# Patient Record
Sex: Female | Born: 1950 | Race: White | Hispanic: No | Smoking: Former smoker
Health system: Southern US, Community
[De-identification: ages and names within clinical notes are randomized; demographics above are authoritative.]

## PROBLEM LIST (undated history)

## (undated) DIAGNOSIS — D649 Anemia, unspecified: Secondary | ICD-10-CM

## (undated) DIAGNOSIS — N189 Chronic kidney disease, unspecified: Secondary | ICD-10-CM

## (undated) DIAGNOSIS — E119 Type 2 diabetes mellitus without complications: Secondary | ICD-10-CM

## (undated) DIAGNOSIS — I10 Essential (primary) hypertension: Secondary | ICD-10-CM

## (undated) DIAGNOSIS — I509 Heart failure, unspecified: Secondary | ICD-10-CM

## (undated) DIAGNOSIS — I499 Cardiac arrhythmia, unspecified: Secondary | ICD-10-CM

---

## 2022-05-01 ENCOUNTER — Other Ambulatory Visit: Payer: Self-pay

## 2022-05-01 ENCOUNTER — Emergency Department (HOSPITAL_COMMUNITY): Payer: Medicare Other

## 2022-05-01 ENCOUNTER — Emergency Department (HOSPITAL_COMMUNITY)
Admission: EM | Admit: 2022-05-01 | Discharge: 2022-05-01 | Disposition: A | Discharge status: Home / Self Care | Payer: Medicare Other | Attending: Emergency Medicine | Admitting: Emergency Medicine

## 2022-05-01 ENCOUNTER — Encounter (HOSPITAL_COMMUNITY): Payer: Self-pay | Admitting: Emergency Medicine

## 2022-05-01 DIAGNOSIS — I120 Hypertensive chronic kidney disease with stage 5 chronic kidney disease or end stage renal disease: Secondary | ICD-10-CM | POA: Diagnosis not present

## 2022-05-01 DIAGNOSIS — Z992 Dependence on renal dialysis: Secondary | ICD-10-CM | POA: Diagnosis not present

## 2022-05-01 DIAGNOSIS — N186 End stage renal disease: Secondary | ICD-10-CM | POA: Insufficient documentation

## 2022-05-01 DIAGNOSIS — R0602 Shortness of breath: Secondary | ICD-10-CM | POA: Diagnosis present

## 2022-05-01 LAB — TROPONIN I (HIGH SENSITIVITY)
Troponin I (High Sensitivity): 14 ng/L (ref <18)
Troponin I (High Sensitivity): 13 ng/L (ref <18)

## 2022-05-01 LAB — COMPREHENSIVE METABOLIC PANEL
ALT: 23 U/L (ref 0–44)
AST: 29 U/L (ref 15–41)
Albumin: 2.7 g/dL — ABNORMAL LOW (ref 3.5–5.0)
Alkaline Phosphatase: 138 U/L — ABNORMAL HIGH (ref 38–126)
Anion gap: 10 (ref 5–15)
BUN: 22 mg/dL (ref 8–23)
CO2: 31 mmol/L (ref 22–32)
Calcium: 7.9 mg/dL — ABNORMAL LOW (ref 8.9–10.3)
Chloride: 100 mmol/L (ref 98–111)
Creatinine, Ser: 4.18 mg/dL — ABNORMAL HIGH (ref 0.44–1.00)
GFR, Estimated: 11 mL/min — ABNORMAL LOW (ref >60)
Glucose, Bld: 110 mg/dL — ABNORMAL HIGH (ref 70–99)
Potassium: 3.4 mmol/L — ABNORMAL LOW (ref 3.5–5.1)
Sodium: 141 mmol/L (ref 135–145)
Total Bilirubin: 1.3 mg/dL — ABNORMAL HIGH (ref 0.3–1.2)
Total Protein: 5.8 g/dL — ABNORMAL LOW (ref 6.5–8.1)

## 2022-05-01 LAB — CBC WITH DIFFERENTIAL/PLATELET
Abs Immature Granulocytes: 0.02 10*3/uL (ref 0.00–0.07)
Basophils Absolute: 0 10*3/uL (ref 0.0–0.1)
Basophils Relative: 1 %
Eosinophils Absolute: 0.4 10*3/uL (ref 0.0–0.5)
Eosinophils Relative: 11 %
HCT: 29.4 % — ABNORMAL LOW (ref 36.0–46.0)
Hemoglobin: 10.2 g/dL — ABNORMAL LOW (ref 12.0–15.0)
Immature Granulocytes: 1 %
Lymphocytes Relative: 19 %
Lymphs Abs: 0.7 10*3/uL (ref 0.7–4.0)
MCH: 36.3 pg — ABNORMAL HIGH (ref 26.0–34.0)
MCHC: 34.7 g/dL (ref 30.0–36.0)
MCV: 104.6 fL — ABNORMAL HIGH (ref 80.0–100.0)
Monocytes Absolute: 0.3 10*3/uL (ref 0.1–1.0)
Monocytes Relative: 9 %
Neutro Abs: 2.1 10*3/uL (ref 1.7–7.7)
Neutrophils Relative %: 59 %
Platelets: 39 10*3/uL — ABNORMAL LOW (ref 150–400)
RBC: 2.81 MIL/uL — ABNORMAL LOW (ref 3.87–5.11)
RDW: 13.7 % (ref 11.5–15.5)
WBC: 3.5 10*3/uL — ABNORMAL LOW (ref 4.0–10.5)
nRBC: 0 % (ref 0.0–0.2)

## 2022-05-01 LAB — MAGNESIUM: Magnesium: 2.1 mg/dL (ref 1.7–2.4)

## 2022-05-01 LAB — BRAIN NATRIURETIC PEPTIDE: B Natriuretic Peptide: 446.1 pg/mL — ABNORMAL HIGH (ref 0.0–100.0)

## 2022-05-01 NOTE — ED Provider Notes (Addendum)
?[place] ?Provider Note ? ? ?CSN: [id number] ?Arrival date & time: 05/01/22  1004 ? ?  ? ?History ? ?Chief Complaint  ?Patient presents with  ? Shortness of Breath  ? Hypotension  ? ? ?Dorothy Cooper is a 72 y.o. female. ? ?72 year old female with prior medical history as detailed below presents for evaluation.  Patient reportedly was at dialysis.  Patient's dialysis session was stopped after she complained of shortness of breath.  Patient reports improvement in her symptoms now.  Patient's blood pressure may have transiently dropped during dialysis as well. ? ?Patient is otherwise unable to provide significant details of the events leading to her visit today. ? ?She does not appear to have acute complaint. ? ?The history is provided by the patient and medical records.  ?Shortness of Breath ?Severity:  Mild ?Onset quality:  Unable to specify ?Timing:  Unable to specify ?Progression:  Unable to specify ? ?  ? ?Home Medications ?Prior to Admission medications   ?Not on File  ?   ? ?Allergies    ?Patient has no allergy information on record.   ? ?Review of Systems   ?Review of Systems  ?Respiratory:  Positive for shortness of breath.   ? ?Physical Exam ?Updated Vital Signs ?BP (!) 141/89   Pulse 80   Temp 98.7 ?F (37.1 ?C) (Oral)   Resp 17   LMP  (LMP Unknown)   SpO2 96%  ?Physical Exam ?Vitals and nursing note reviewed.  ?Constitutional:   ?   General: She is not in acute distress. ?   Appearance: Normal appearance. She is well-developed.  ?HENT:  ?   Head: Normocephalic and atraumatic.  ?Eyes:  ?   Conjunctiva/sclera: Conjunctivae normal.  ?   Pupils: Pupils are equal, round, and reactive to light.  ?Cardiovascular:  ?   Rate and Rhythm: Normal rate and regular rhythm.  ?   Heart sounds: Normal heart sounds.  ?Pulmonary:  ?   Effort: Pulmonary effort is normal. No respiratory distress.  ?   Breath sounds: Normal breath sounds.  ?Chest:  ?   Comments: Dialysis access catheter  in the right anterior chest wall.  No surrounding erythema noted. ?Abdominal:  ?   General: There is no distension.  ?   Palpations: Abdomen is soft.  ?   Tenderness: There is no abdominal tenderness.  ?Musculoskeletal:     ?   General: No deformity. Normal range of motion.  ?   Cervical back: Normal range of motion and neck supple.  ?   Right lower leg: Edema present.  ?   Left lower leg: Edema present.  ?   Comments: AV fistula in left upper extremity with palpable thrill and no surrounding erythema or signs of cellulitis.  ?Skin: ?   General: Skin is warm and dry.  ?Neurological:  ?   General: No focal deficit present.  ?   Mental Status: She is alert and oriented to person, place, and time.  ? ? ?ED Results / Procedures / Treatments   ?Labs ?(all labs ordered are listed, but only abnormal results are displayed) ?Labs Reviewed  ?COMPREHENSIVE METABOLIC PANEL - Abnormal; Notable for the following components:  ?    Result Value  ? Potassium 3.4 (*)   ? Glucose, Bld 110 (*)   ? Creatinine, Ser 4.18 (*)   ? Calcium 7.9 (*)   ? Total Protein 5.8 (*)   ? Albumin 2.7 (*)   ? Alkaline Phosphatase  138 (*)   ? Total Bilirubin 1.3 (*)   ? GFR, Estimated 11 (*)   ? All other components within normal limits  ?CBC WITH DIFFERENTIAL/PLATELET - Abnormal; Notable for the following components:  ? WBC 3.5 (*)   ? RBC 2.81 (*)   ? Hemoglobin 10.2 (*)   ? HCT 29.4 (*)   ? MCV 104.6 (*)   ? MCH 36.3 (*)   ? Platelets 39 (*)   ? All other components within normal limits  ?BRAIN NATRIURETIC PEPTIDE - Abnormal; Notable for the following components:  ? B Natriuretic Peptide 446.1 (*)   ? All other components within normal limits  ?MAGNESIUM  ?CBG MONITORING, ED  ?TROPONIN I (HIGH SENSITIVITY)  ?TROPONIN I (HIGH SENSITIVITY)  ? ? ?EKG ?EKG Interpretation ? ?Date/Time:  Tuesday May 01 2022 10:01:21 EDT ?Ventricular Rate:  80 ?PR Interval:  150 ?QRS Duration: 72 ?QT Interval:  420 ?QTC Calculation: 484 ?R Axis:   36 ?Text  Interpretation: Normal sinus rhythm Cannot rule out Anterior infarct , age undetermined Abnormal ECG No previous ECGs available Confirmed by Dene Gentry [phone]) on 05/01/2022 1:02:01 PM ? ?Radiology ?DG Chest 2 View ? ?Result Date: 05/01/2022 ?CLINICAL DATA:  Shortness of breath. EXAM: CHEST - 2 VIEW COMPARISON:  None Available. FINDINGS: Mild cardiomegaly is noted. Mild bibasilar subsegmental atelectasis or possibly edema is noted with small bilateral pleural effusions. Bony thorax is unremarkable. IMPRESSION: Mild bibasilar subsegmental atelectasis or possibly edema is noted with small bilateral pleural effusions. Electronically Signed   By: Marijo Conception M.D.   On: 05/01/2022 10:55   ? ?Procedures ?Procedures  ? ? ?Medications Ordered in ED ?Medications - No data to display ? ?ED Course/ Medical Decision Making/ A&P ?  ?                        ?Medical Decision Making ? ? ?Medical Screen Complete ? ?This patient presented to the ED with complaint of SOB/ESRD. ? ?This complaint involves an extensive number of treatment options. The initial differential diagnosis includes, but is not limited to, fluid overload, pneumonia, metabolic abnormality, etc. ? ?This presentation is: Acute, Chronic, Self-Limited, Previously Undiagnosed, Uncertain Prognosis, Complicated, Systemic Symptoms, and Threat to Life/Bodily Function ? ?Patient with apparent reported transient drop in blood pressure during dialysis session today. ? ?Patient without current acute complaint. ? ?Patient's hemodynamics here in the ED are reassuringly normal. ? ?Screening labs obtained without significant abnormality.  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  Patient is currently on a Tuesday, Thursday, Saturday dialysis schedule. ? ?Patient without evidence of significant acute pathology on work-up here in the ED. ? ?She is appropriate for discharge. ? ?However, patient's demographics are not updated.  It is unclear which facility she resides  at.  Contact information for next of kin is also not up-to-date. ? ?1630   Additional history obtained from patient's granddaughter.  Patient now at a new nursing facility and new dialysis center.  Patient apparently did not receive midodrine as she has previously prior to initiating dialysis.  This was the reason for her drop in blood pressure.  Patient does not meet criteria for inpatient dialysis today.  Her potassium is normal.  She is without evidence of significant fluid overload or hypoxemia.  Patient is appropriate for discharge and should be able to get her next dialysis session on Thursday.  Patient's granddaughter has already spoken to her new facility and advised them  that the midodrine should be given to the patient prior to transport to dialysis. ? ?Co morbidities that complicated the patient's evaluation ? ?ESRD on HD ? ? ?Additional history obtained: ? ?External records from outside sources obtained and reviewed including prior ED visits and prior Inpatient records.  ? ? ?Lab Tests: ? ?I ordered and personally interpreted labs.  The pertinent results include: CBC, CMP, magnesium, troponin, BNP ? ? ?Imaging Studies ordered: ? ?I ordered imaging studies including chest x-ray ?I independently visualized and interpreted obtained imaging which showed NAD ?I agree with the radiologist interpretation. ? ? ?Cardiac Monitoring: ? ?The patient was maintained on a cardiac monitor.  I personally viewed and interpreted the cardiac monitor which showed an underlying rhythm of: NSR ? ?Problem List / ED Course: ? ?ESRD ? ? ?Reevaluation: ? ?After the interventions noted above, I reevaluated the patient and found that they have: stayed the same ? ? ?Disposition: ? ?After consideration of the diagnostic results and the patients response to treatment, I feel that the patent would benefit from close outpatient follow-up.  ? ? ? ? ? ? ? ? ?Final Clinical Impression(s) / ED Diagnoses ?Final diagnoses:  ?ESRD (end stage  renal disease) ([place])  ? ? ?Rx / DC Orders ?ED Discharge Orders   ? ? None  ? ?  ? ? ?  ?Valarie Merino, MD ?05/01/22 1417 ? ?  ?Valarie Merino, MD ?05/02/22 [phone] ? ?

## 2022-05-01 NOTE — ED Notes (Signed)
Patient currently residing at [facility].  ?

## 2022-05-01 NOTE — ED Provider Notes (Incomplete Revision)
?[place] ?Provider Note ? ? ?CSN: [id number] ?Arrival date & time: 05/01/22  1004 ? ?  ? ?History ? ?Chief Complaint  ?Patient presents with  ?? Shortness of Breath  ?? Hypotension  ? ? ?Dorothy Cooper is a 72 y.o. female. ? ?72 year old female with prior medical history as detailed below presents for evaluation.  Patient reportedly was at dialysis.  Patient's dialysis session was stopped after she complained of shortness of breath.  Patient reports improvement in her symptoms now.  Patient's blood pressure may have transiently dropped during dialysis as well. ? ?Patient is otherwise unable to provide significant details of the events leading to her visit today. ? ?She does not appear to have acute complaint. ? ?The history is provided by the patient and medical records.  ?Shortness of Breath ?Severity:  Mild ?Onset quality:  Unable to specify ?Timing:  Unable to specify ?Progression:  Unable to specify ? ?  ? ?Home Medications ?Prior to Admission medications   ?Not on File  ?   ? ?Allergies    ?Patient has no allergy information on record.   ? ?Review of Systems   ?Review of Systems  ?Respiratory:  Positive for shortness of breath.   ? ?Physical Exam ?Updated Vital Signs ?BP (!) 141/89   Pulse 80   Temp 98.7 ?F (37.1 ?C) (Oral)   Resp 17   LMP  (LMP Unknown)   SpO2 96%  ?Physical Exam ?Vitals and nursing note reviewed.  ?Constitutional:   ?   General: She is not in acute distress. ?   Appearance: Normal appearance. She is well-developed.  ?HENT:  ?   Head: Normocephalic and atraumatic.  ?Eyes:  ?   Conjunctiva/sclera: Conjunctivae normal.  ?   Pupils: Pupils are equal, round, and reactive to light.  ?Cardiovascular:  ?   Rate and Rhythm: Normal rate and regular rhythm.  ?   Heart sounds: Normal heart sounds.  ?Pulmonary:  ?   Effort: Pulmonary effort is normal. No respiratory distress.  ?   Breath sounds: Normal breath sounds.  ?Chest:  ?   Comments: Dialysis access  catheter in the right anterior chest wall.  No surrounding erythema noted. ?Abdominal:  ?   General: There is no distension.  ?   Palpations: Abdomen is soft.  ?   Tenderness: There is no abdominal tenderness.  ?Musculoskeletal:     ?   General: No deformity. Normal range of motion.  ?   Cervical back: Normal range of motion and neck supple.  ?   Right lower leg: Edema present.  ?   Left lower leg: Edema present.  ?   Comments: AV fistula in left upper extremity with palpable thrill and no surrounding erythema or signs of cellulitis.  ?Skin: ?   General: Skin is warm and dry.  ?Neurological:  ?   General: No focal deficit present.  ?   Mental Status: She is alert and oriented to person, place, and time.  ? ? ?ED Results / Procedures / Treatments   ?Labs ?(all labs ordered are listed, but only abnormal results are displayed) ?Labs Reviewed  ?COMPREHENSIVE METABOLIC PANEL - Abnormal; Notable for the following components:  ?    Result Value  ? Potassium 3.4 (*)   ? Glucose, Bld 110 (*)   ? Creatinine, Ser 4.18 (*)   ? Calcium 7.9 (*)   ? Total Protein 5.8 (*)   ? Albumin 2.7 (*)   ? Alkaline Phosphatase  138 (*)   ? Total Bilirubin 1.3 (*)   ? GFR, Estimated 11 (*)   ? All other components within normal limits  ?CBC WITH DIFFERENTIAL/PLATELET - Abnormal; Notable for the following components:  ? WBC 3.5 (*)   ? RBC 2.81 (*)   ? Hemoglobin 10.2 (*)   ? HCT 29.4 (*)   ? MCV 104.6 (*)   ? MCH 36.3 (*)   ? Platelets 39 (*)   ? All other components within normal limits  ?BRAIN NATRIURETIC PEPTIDE - Abnormal; Notable for the following components:  ? B Natriuretic Peptide 446.1 (*)   ? All other components within normal limits  ?MAGNESIUM  ?CBG MONITORING, ED  ?TROPONIN I (HIGH SENSITIVITY)  ?TROPONIN I (HIGH SENSITIVITY)  ? ? ?EKG ?EKG Interpretation ? ?Date/Time:  Tuesday May 01 2022 10:01:21 EDT ?Ventricular Rate:  80 ?PR Interval:  150 ?QRS Duration: 72 ?QT Interval:  420 ?QTC Calculation: 484 ?R Axis:   36 ?Text  Interpretation: Normal sinus rhythm Cannot rule out Anterior infarct , age undetermined Abnormal ECG No previous ECGs available Confirmed by Dene Gentry [phone]) on 05/01/2022 1:02:01 PM ? ?Radiology ?DG Chest 2 View ? ?Result Date: 05/01/2022 ?CLINICAL DATA:  Shortness of breath. EXAM: CHEST - 2 VIEW COMPARISON:  None Available. FINDINGS: Mild cardiomegaly is noted. Mild bibasilar subsegmental atelectasis or possibly edema is noted with small bilateral pleural effusions. Bony thorax is unremarkable. IMPRESSION: Mild bibasilar subsegmental atelectasis or possibly edema is noted with small bilateral pleural effusions. Electronically Signed   By: Marijo Conception M.D.   On: 05/01/2022 10:55   ? ?Procedures ?Procedures  ? ? ?Medications Ordered in ED ?Medications - No data to display ? ?ED Course/ Medical Decision Making/ A&P ?  ?                        ?Medical Decision Making ? ? ?Medical Screen Complete ? ?This patient presented to the ED with complaint of SOB/ESRD. ? ?This complaint involves an extensive number of treatment options. The initial differential diagnosis includes, but is not limited to, fluid overload, pneumonia, metabolic abnormality, etc. ? ?This presentation is: Acute, Chronic, Self-Limited, Previously Undiagnosed, Uncertain Prognosis, Complicated, Systemic Symptoms, and Threat to Life/Bodily Function ? ?Patient with apparent reported transient drop in blood pressure during dialysis session today. ? ?Patient without current acute complaint. ? ?Patient's hemodynamics here in the ED are reassuringly normal. ? ?Screening labs obtained without significant abnormality.  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  Patient is currently on a Tuesday, Thursday, Saturday dialysis schedule. ? ?Patient without evidence of significant acute pathology on work-up here in the ED. ? ?She is appropriate for discharge. ? ?However, patient's demographics are not updated.  It is unclear which facility she resides  at.  Contact information for next of kin is also not up-to-date. ? ?1630   Additional history obtained from patient's granddaughter.  Patient with new facility and new dialysis chair.  Patient apparently did not receive midodrine as she has previously prior to initiating dialysis.  This was the reason for her drop in blood pressure.  Patient does not meet criteria for dialysis today.  Her potassium is normal.  She is without evidence of significant fluid overload or hypoxemia.  Patient is appropriate for discharge and should be able to get her next dialysis session on Thursday.  Patient's granddaughter is already spoken to her new facility and advised them that the midodrine should  be given to the patient prior to transport to dialysis. ? ?Co morbidities that complicated the patient's evaluation ? ?ESRD on HD ? ? ?Additional history obtained: ? ?External records from outside sources obtained and reviewed including prior ED visits and prior Inpatient records.  ? ? ?Lab Tests: ? ?I ordered and personally interpreted labs.  The pertinent results include: CBC, CMP, magnesium, troponin, BNP ? ? ?Imaging Studies ordered: ? ?I ordered imaging studies including chest x-ray ?I independently visualized and interpreted obtained imaging which showed NAD ?I agree with the radiologist interpretation. ? ? ?Cardiac Monitoring: ? ?The patient was maintained on a cardiac monitor.  I personally viewed and interpreted the cardiac monitor which showed an underlying rhythm of: NSR ? ?Problem List / ED Course: ? ?ESRD ? ? ?Reevaluation: ? ?After the interventions noted above, I reevaluated the patient and found that they have: stayed the same ? ? ?Disposition: ? ?After consideration of the diagnostic results and the patients response to treatment, I feel that the patent would benefit from close outpatient follow-up.  ? ? ? ? ? ? ? ? ?Final Clinical Impression(s) / ED Diagnoses ?Final diagnoses:  ?ESRD (end stage renal disease) ([place])   ? ? ?Rx / DC Orders ?ED Discharge Orders   ? ? None  ? ?  ? ? ?  ?Valarie Merino, MD ?05/01/22 1417 ? ?

## 2022-05-01 NOTE — ED Provider Triage Note (Signed)
Emergency Medicine Provider Triage Evaluation Note ? ?Dorothy Cooper , a 72 y.o. female  was evaluated in triage.  Patient came from dialysis.  She reports that she all of a sudden got short of breath so they stopped and sent her to the emergency department.  Says that she is not oxygen only during dialysis but does not wear any at home.  Says that she did not complete dialysis. ? ?Also reports a history of heart failure and diabetes ?Review of Systems  ?Positive: Dizziness and weakness and some chest discomfort, increased from usual ?Negative: Syncope ? ?Physical Exam  ?BP (!) 124/53 (BP Location: Right Arm)   Pulse 80   Temp 98.7 ?F (37.1 ?C) (Oral)   Resp 16   SpO2 97%  ?Gen:   Awake, no distress   ?Resp:  Normal effort  ?MSK:   Moves extremities without difficulty  ?Other:  Regular rate and rhythm, decreased low lung field.  Normal work of breathing ? ?Medical Decision Making  ?Medically screening exam initiated at 10:22 AM.  Appropriate orders placed.  Dorothy Cooper was informed that the remainder of the evaluation will be completed by another provider, this initial triage assessment does not replace that evaluation, and the importance of remaining in the ED until their evaluation is complete. ? ? ?  ?Rhae Hammock, PA-C ?05/01/22 1028 ? ?

## 2022-05-01 NOTE — ED Notes (Signed)
Pt sent home with [facility] in transport [place].  Christina-NT and I got her dressed and into wheelchair and the transport driver roller her out in her wheelchair.  Paperwork, clothes, hoya pad and paperwork all went with pt.  ?

## 2022-05-01 NOTE — ED Triage Notes (Signed)
EMS stated, from dialysis her BP dropped , stopped dialysis and BP came back up and she had some SOB ?

## 2022-05-01 NOTE — Discharge Instructions (Addendum)
Return for any problem.  ?

## 2022-08-19 ENCOUNTER — Emergency Department (HOSPITAL_COMMUNITY): Payer: Medicare Other

## 2022-08-19 ENCOUNTER — Inpatient Hospital Stay (HOSPITAL_COMMUNITY)
Admission: EM | Admit: 2022-08-19 | Discharge: 2022-08-25 | DRG: 689 | Disposition: A | Discharge status: Hospice | Payer: Medicare Other | Attending: Internal Medicine | Admitting: Internal Medicine

## 2022-08-19 ENCOUNTER — Encounter (HOSPITAL_COMMUNITY): Payer: Self-pay | Admitting: Internal Medicine

## 2022-08-19 ENCOUNTER — Other Ambulatory Visit: Payer: Self-pay

## 2022-08-19 DIAGNOSIS — M898X9 Other specified disorders of bone, unspecified site: Secondary | ICD-10-CM | POA: Diagnosis present

## 2022-08-19 DIAGNOSIS — D631 Anemia in chronic kidney disease: Secondary | ICD-10-CM | POA: Diagnosis present

## 2022-08-19 DIAGNOSIS — Z1612 Extended spectrum beta lactamase (ESBL) resistance: Secondary | ICD-10-CM | POA: Diagnosis present

## 2022-08-19 DIAGNOSIS — N390 Urinary tract infection, site not specified: Principal | ICD-10-CM | POA: Diagnosis present

## 2022-08-19 DIAGNOSIS — K7682 Hepatic encephalopathy: Secondary | ICD-10-CM | POA: Diagnosis present

## 2022-08-19 DIAGNOSIS — R4781 Slurred speech: Secondary | ICD-10-CM | POA: Diagnosis present

## 2022-08-19 DIAGNOSIS — I132 Hypertensive heart and chronic kidney disease with heart failure and with stage 5 chronic kidney disease, or end stage renal disease: Secondary | ICD-10-CM | POA: Diagnosis present

## 2022-08-19 DIAGNOSIS — N3000 Acute cystitis without hematuria: Secondary | ICD-10-CM

## 2022-08-19 DIAGNOSIS — Z886 Allergy status to analgesic agent status: Secondary | ICD-10-CM

## 2022-08-19 DIAGNOSIS — R066 Hiccough: Secondary | ICD-10-CM | POA: Diagnosis present

## 2022-08-19 DIAGNOSIS — I5032 Chronic diastolic (congestive) heart failure: Secondary | ICD-10-CM | POA: Diagnosis present

## 2022-08-19 DIAGNOSIS — Z992 Dependence on renal dialysis: Secondary | ICD-10-CM

## 2022-08-19 DIAGNOSIS — L8990 Pressure ulcer of unspecified site, unspecified stage: Secondary | ICD-10-CM | POA: Insufficient documentation

## 2022-08-19 DIAGNOSIS — Z993 Dependence on wheelchair: Secondary | ICD-10-CM

## 2022-08-19 DIAGNOSIS — G43909 Migraine, unspecified, not intractable, without status migrainosus: Secondary | ICD-10-CM | POA: Diagnosis present

## 2022-08-19 DIAGNOSIS — M4854XA Collapsed vertebra, not elsewhere classified, thoracic region, initial encounter for fracture: Secondary | ICD-10-CM | POA: Diagnosis present

## 2022-08-19 DIAGNOSIS — E860 Dehydration: Secondary | ICD-10-CM | POA: Diagnosis present

## 2022-08-19 DIAGNOSIS — D696 Thrombocytopenia, unspecified: Secondary | ICD-10-CM | POA: Diagnosis present

## 2022-08-19 DIAGNOSIS — N186 End stage renal disease: Secondary | ICD-10-CM | POA: Diagnosis present

## 2022-08-19 DIAGNOSIS — R4182 Altered mental status, unspecified: Secondary | ICD-10-CM | POA: Diagnosis not present

## 2022-08-19 DIAGNOSIS — D539 Nutritional anemia, unspecified: Secondary | ICD-10-CM | POA: Diagnosis present

## 2022-08-19 DIAGNOSIS — E11649 Type 2 diabetes mellitus with hypoglycemia without coma: Secondary | ICD-10-CM | POA: Diagnosis not present

## 2022-08-19 DIAGNOSIS — I9589 Other hypotension: Secondary | ICD-10-CM

## 2022-08-19 DIAGNOSIS — L89321 Pressure ulcer of left buttock, stage 1: Secondary | ICD-10-CM | POA: Diagnosis present

## 2022-08-19 DIAGNOSIS — K7460 Unspecified cirrhosis of liver: Secondary | ICD-10-CM | POA: Diagnosis present

## 2022-08-19 DIAGNOSIS — J449 Chronic obstructive pulmonary disease, unspecified: Secondary | ICD-10-CM | POA: Diagnosis present

## 2022-08-19 DIAGNOSIS — L89622 Pressure ulcer of left heel, stage 2: Secondary | ICD-10-CM | POA: Diagnosis present

## 2022-08-19 DIAGNOSIS — G9341 Metabolic encephalopathy: Secondary | ICD-10-CM | POA: Diagnosis present

## 2022-08-19 DIAGNOSIS — Z880 Allergy status to penicillin: Secondary | ICD-10-CM

## 2022-08-19 DIAGNOSIS — R791 Abnormal coagulation profile: Secondary | ICD-10-CM | POA: Diagnosis present

## 2022-08-19 DIAGNOSIS — H409 Unspecified glaucoma: Secondary | ICD-10-CM | POA: Diagnosis present

## 2022-08-19 DIAGNOSIS — S32000A Wedge compression fracture of unspecified lumbar vertebra, initial encounter for closed fracture: Secondary | ICD-10-CM | POA: Diagnosis present

## 2022-08-19 DIAGNOSIS — E1122 Type 2 diabetes mellitus with diabetic chronic kidney disease: Secondary | ICD-10-CM | POA: Diagnosis present

## 2022-08-19 DIAGNOSIS — E861 Hypovolemia: Secondary | ICD-10-CM

## 2022-08-19 DIAGNOSIS — L89892 Pressure ulcer of other site, stage 2: Secondary | ICD-10-CM | POA: Diagnosis present

## 2022-08-19 DIAGNOSIS — R161 Splenomegaly, not elsewhere classified: Secondary | ICD-10-CM | POA: Diagnosis present

## 2022-08-19 DIAGNOSIS — K7210 Chronic hepatic failure without coma: Secondary | ICD-10-CM | POA: Diagnosis present

## 2022-08-19 DIAGNOSIS — G894 Chronic pain syndrome: Secondary | ICD-10-CM | POA: Diagnosis present

## 2022-08-19 DIAGNOSIS — M4856XA Collapsed vertebra, not elsewhere classified, lumbar region, initial encounter for fracture: Secondary | ICD-10-CM | POA: Diagnosis present

## 2022-08-19 DIAGNOSIS — D61818 Other pancytopenia: Secondary | ICD-10-CM | POA: Diagnosis present

## 2022-08-19 DIAGNOSIS — Z515 Encounter for palliative care: Secondary | ICD-10-CM

## 2022-08-19 DIAGNOSIS — Z79899 Other long term (current) drug therapy: Secondary | ICD-10-CM

## 2022-08-19 DIAGNOSIS — D472 Monoclonal gammopathy: Secondary | ICD-10-CM | POA: Diagnosis present

## 2022-08-19 DIAGNOSIS — Z87891 Personal history of nicotine dependence: Secondary | ICD-10-CM

## 2022-08-19 DIAGNOSIS — Z8249 Family history of ischemic heart disease and other diseases of the circulatory system: Secondary | ICD-10-CM

## 2022-08-19 DIAGNOSIS — I959 Hypotension, unspecified: Secondary | ICD-10-CM | POA: Diagnosis present

## 2022-08-19 DIAGNOSIS — Z66 Do not resuscitate: Secondary | ICD-10-CM | POA: Diagnosis present

## 2022-08-19 DIAGNOSIS — E785 Hyperlipidemia, unspecified: Secondary | ICD-10-CM | POA: Diagnosis present

## 2022-08-19 DIAGNOSIS — Z6823 Body mass index (BMI) 23.0-23.9, adult: Secondary | ICD-10-CM

## 2022-08-19 DIAGNOSIS — Z888 Allergy status to other drugs, medicaments and biological substances status: Secondary | ICD-10-CM

## 2022-08-19 DIAGNOSIS — I5042 Chronic combined systolic (congestive) and diastolic (congestive) heart failure: Secondary | ICD-10-CM | POA: Diagnosis present

## 2022-08-19 DIAGNOSIS — R64 Cachexia: Secondary | ICD-10-CM | POA: Diagnosis present

## 2022-08-19 DIAGNOSIS — Z638 Other specified problems related to primary support group: Secondary | ICD-10-CM

## 2022-08-19 DIAGNOSIS — I480 Paroxysmal atrial fibrillation: Secondary | ICD-10-CM | POA: Diagnosis present

## 2022-08-19 DIAGNOSIS — N939 Abnormal uterine and vaginal bleeding, unspecified: Secondary | ICD-10-CM | POA: Diagnosis present

## 2022-08-19 DIAGNOSIS — G928 Other toxic encephalopathy: Secondary | ICD-10-CM | POA: Diagnosis present

## 2022-08-19 DIAGNOSIS — D684 Acquired coagulation factor deficiency: Secondary | ICD-10-CM | POA: Diagnosis present

## 2022-08-19 DIAGNOSIS — Z833 Family history of diabetes mellitus: Secondary | ICD-10-CM

## 2022-08-19 DIAGNOSIS — E119 Type 2 diabetes mellitus without complications: Secondary | ICD-10-CM | POA: Diagnosis present

## 2022-08-19 DIAGNOSIS — Z7901 Long term (current) use of anticoagulants: Secondary | ICD-10-CM

## 2022-08-19 HISTORY — DX: Type 2 diabetes mellitus without complications: E11.9

## 2022-08-19 HISTORY — DX: Essential (primary) hypertension: I10

## 2022-08-19 HISTORY — DX: Heart failure, unspecified: I50.9

## 2022-08-19 HISTORY — DX: Chronic kidney disease, unspecified: N18.9

## 2022-08-19 HISTORY — DX: Cardiac arrhythmia, unspecified: I49.9

## 2022-08-19 HISTORY — DX: Anemia, unspecified: D64.9

## 2022-08-19 LAB — CBC WITH DIFFERENTIAL/PLATELET
Abs Immature Granulocytes: 0.03 10*3/uL (ref 0.00–0.07)
Basophils Absolute: 0 10*3/uL (ref 0.0–0.1)
Basophils Relative: 0 %
Eosinophils Absolute: 0 10*3/uL (ref 0.0–0.5)
Eosinophils Relative: 0 %
HCT: 27.4 % — ABNORMAL LOW (ref 36.0–46.0)
Hemoglobin: 9.1 g/dL — ABNORMAL LOW (ref 12.0–15.0)
Immature Granulocytes: 1 %
Lymphocytes Relative: 6 %
Lymphs Abs: 0.3 10*3/uL — ABNORMAL LOW (ref 0.7–4.0)
MCH: 35.4 pg — ABNORMAL HIGH (ref 26.0–34.0)
MCHC: 33.2 g/dL (ref 30.0–36.0)
MCV: 106.6 fL — ABNORMAL HIGH (ref 80.0–100.0)
Monocytes Absolute: 0.3 10*3/uL (ref 0.1–1.0)
Monocytes Relative: 6 %
Neutro Abs: 4.1 10*3/uL (ref 1.7–7.7)
Neutrophils Relative %: 87 %
Platelets: 26 10*3/uL — CL (ref 150–400)
RBC: 2.57 MIL/uL — ABNORMAL LOW (ref 3.87–5.11)
RDW: 17 % — ABNORMAL HIGH (ref 11.5–15.5)
WBC: 4.7 10*3/uL (ref 4.0–10.5)
nRBC: 0 % (ref 0.0–0.2)

## 2022-08-19 LAB — BLOOD GAS, VENOUS
Acid-Base Excess: 9.4 mmol/L — ABNORMAL HIGH (ref 0.0–2.0)
Bicarbonate: 35.3 mmol/L — ABNORMAL HIGH (ref 20.0–28.0)
Drawn by: 164
O2 Saturation: 78.4 %
Patient temperature: 37
pCO2, Ven: 52 mmHg (ref 44–60)
pH, Ven: 7.44 — ABNORMAL HIGH (ref 7.25–7.43)
pO2, Ven: 47 mmHg — ABNORMAL HIGH (ref 32–45)

## 2022-08-19 LAB — COMPREHENSIVE METABOLIC PANEL
ALT: 33 U/L (ref 0–44)
AST: 60 U/L — ABNORMAL HIGH (ref 15–41)
Albumin: 2.1 g/dL — ABNORMAL LOW (ref 3.5–5.0)
Alkaline Phosphatase: 75 U/L (ref 38–126)
Anion gap: 12 (ref 5–15)
BUN: 52 mg/dL — ABNORMAL HIGH (ref 8–23)
CO2: 28 mmol/L (ref 22–32)
Calcium: 10.1 mg/dL (ref 8.9–10.3)
Chloride: 92 mmol/L — ABNORMAL LOW (ref 98–111)
Creatinine, Ser: 5.35 mg/dL — ABNORMAL HIGH (ref 0.44–1.00)
GFR, Estimated: 8 mL/min — ABNORMAL LOW (ref >60)
Glucose, Bld: 179 mg/dL — ABNORMAL HIGH (ref 70–99)
Potassium: 4.1 mmol/L (ref 3.5–5.1)
Sodium: 132 mmol/L — ABNORMAL LOW (ref 135–145)
Total Bilirubin: 2.2 mg/dL — ABNORMAL HIGH (ref 0.3–1.2)
Total Protein: 5.4 g/dL — ABNORMAL LOW (ref 6.5–8.1)

## 2022-08-19 LAB — ABO/RH: ABO/RH(D): O POS

## 2022-08-19 LAB — DIC (DISSEMINATED INTRAVASCULAR COAGULATION)PANEL
D-Dimer, Quant: 3.19 ug/mL-FEU — ABNORMAL HIGH (ref 0.00–0.50)
Fibrinogen: 338 mg/dL (ref 210–475)
INR: 7.4 (ref 0.8–1.2)
Platelets: 21 10*3/uL — CL (ref 150–400)
Prothrombin Time: 62.8 seconds — ABNORMAL HIGH (ref 11.4–15.2)
Smear Review: NONE SEEN
aPTT: 62 seconds — ABNORMAL HIGH (ref 24–36)

## 2022-08-19 LAB — RETICULOCYTES
Immature Retic Fract: 30.1 % — ABNORMAL HIGH (ref 2.3–15.9)
RBC.: 2.84 MIL/uL — ABNORMAL LOW (ref 3.87–5.11)
Retic Count, Absolute: 160.7 10*3/uL (ref 19.0–186.0)
Retic Ct Pct: 5.7 % — ABNORMAL HIGH (ref 0.4–3.1)

## 2022-08-19 LAB — TYPE AND SCREEN
ABO/RH(D): O POS
Antibody Screen: NEGATIVE

## 2022-08-19 LAB — URINALYSIS, ROUTINE W REFLEX MICROSCOPIC
Glucose, UA: NEGATIVE mg/dL
Ketones, ur: 15 mg/dL — AB
Nitrite: POSITIVE — AB
Protein, ur: 100 mg/dL — AB
Specific Gravity, Urine: 1.02 (ref 1.005–1.030)
pH: 6.5 (ref 5.0–8.0)

## 2022-08-19 LAB — IRON AND TIBC
Iron: 23 ug/dL — ABNORMAL LOW (ref 28–170)
Saturation Ratios: UNDETERMINED % (ref 10.4–31.8)
TIBC: UNDETERMINED ug/dL (ref 250–450)
UIBC: UNDETERMINED ug/dL

## 2022-08-19 LAB — AMMONIA: Ammonia: 27 umol/L (ref 9–35)

## 2022-08-19 LAB — URINALYSIS, MICROSCOPIC (REFLEX)
Bacteria, UA: NONE SEEN
Squamous Epithelial / HPF: >50 (ref 0–5)
WBC, UA: >50 WBC/hpf (ref 0–5)

## 2022-08-19 LAB — LACTIC ACID, PLASMA
Lactic Acid, Venous: 2.1 mmol/L (ref 0.5–1.9)
Lactic Acid, Venous: 2.6 mmol/L (ref 0.5–1.9)

## 2022-08-19 LAB — LACTATE DEHYDROGENASE: LDH: 181 U/L (ref 98–192)

## 2022-08-19 LAB — PHOSPHORUS: Phosphorus: 4 mg/dL (ref 2.5–4.6)

## 2022-08-19 LAB — CK: Total CK: 48 U/L (ref 38–234)

## 2022-08-19 LAB — CBG MONITORING, ED: Glucose-Capillary: 161 mg/dL — ABNORMAL HIGH (ref 70–99)

## 2022-08-19 LAB — FERRITIN: Ferritin: 815 ng/mL — ABNORMAL HIGH (ref 11–307)

## 2022-08-19 LAB — MAGNESIUM: Magnesium: 2.9 mg/dL — ABNORMAL HIGH (ref 1.7–2.4)

## 2022-08-19 MED ORDER — LACTATED RINGERS IV BOLUS
1000.0000 mL | Freq: Once | INTRAVENOUS | Status: AC
  Administered 2022-08-19: 1000 mL via INTRAVENOUS

## 2022-08-19 MED ORDER — MIDODRINE HCL 5 MG PO TABS: 10.0000 mg | ORAL_TABLET | Freq: Three times a day (TID) | ORAL | Status: DC

## 2022-08-19 MED ORDER — SODIUM CHLORIDE 0.9 % IV SOLN
1.0000 g | INTRAVENOUS | Status: DC
  Administered 2022-08-20 – 2022-08-22 (×2): 1 g via INTRAVENOUS
  Filled 2022-08-19 (×2): qty 10

## 2022-08-19 MED ORDER — ACETAMINOPHEN 325 MG PO TABS: 650.0000 mg | ORAL_TABLET | Freq: Four times a day (QID) | ORAL | Status: DC | PRN

## 2022-08-19 MED ORDER — METHOCARBAMOL 1000 MG/10ML IJ SOLN: 500.0000 mg | Freq: Four times a day (QID) | INTRAVENOUS | Status: DC | PRN

## 2022-08-19 MED ORDER — FENTANYL CITRATE PF 50 MCG/ML IJ SOSY: 25.0000 ug | PREFILLED_SYRINGE | INTRAMUSCULAR | Status: DC | PRN

## 2022-08-19 MED ORDER — IOHEXOL 300 MG/ML  SOLN
80.0000 mL | Freq: Once | INTRAMUSCULAR | Status: AC | PRN
  Administered 2022-08-19: 80 mL via INTRAVENOUS

## 2022-08-19 MED ORDER — TOPIRAMATE 25 MG PO TABS: 25.0000 mg | ORAL_TABLET | Freq: Two times a day (BID) | ORAL | Status: DC

## 2022-08-19 MED ORDER — SERTRALINE HCL 50 MG PO TABS: 50.0000 mg | ORAL_TABLET | Freq: Every day | ORAL | Status: DC

## 2022-08-19 MED ORDER — ALBUMIN HUMAN 25 % IV SOLN
25.0000 g | Freq: Once | INTRAVENOUS | Status: AC
  Administered 2022-08-19: 25 g via INTRAVENOUS
  Filled 2022-08-19: qty 100

## 2022-08-19 MED ORDER — LACTATED RINGERS IV BOLUS
500.0000 mL | Freq: Once | INTRAVENOUS | Status: AC
  Administered 2022-08-19: 500 mL via INTRAVENOUS

## 2022-08-19 MED ORDER — ACETAMINOPHEN 650 MG RE SUPP: 650.0000 mg | Freq: Four times a day (QID) | RECTAL | Status: DC | PRN

## 2022-08-19 MED ORDER — MIDODRINE HCL 5 MG PO TABS
10.0000 mg | ORAL_TABLET | Freq: Three times a day (TID) | ORAL | Status: DC
  Administered 2022-08-20 – 2022-08-21 (×5): 10 mg via ORAL
  Filled 2022-08-19 (×7): qty 2

## 2022-08-19 MED ORDER — VITAMIN K1 10 MG/ML IJ SOLN
2.0000 mg | Freq: Once | INTRAMUSCULAR | Status: AC
  Administered 2022-08-20: 2 mg via SUBCUTANEOUS
  Filled 2022-08-19: qty 0.2

## 2022-08-19 MED ORDER — HYDROMORPHONE HCL 2 MG PO TABS: 2.0000 mg | ORAL_TABLET | ORAL | Status: DC | PRN

## 2022-08-19 MED ORDER — INSULIN ASPART 100 UNIT/ML IJ SOLN
0.0000 [IU] | INTRAMUSCULAR | Status: DC
  Administered 2022-08-19 – 2022-08-21 (×3): 1 [IU] via SUBCUTANEOUS

## 2022-08-19 MED ORDER — PRAVASTATIN SODIUM 10 MG PO TABS: 10.0000 mg | ORAL_TABLET | Freq: Every day | ORAL | Status: DC

## 2022-08-19 MED ORDER — CEFTRIAXONE SODIUM 1 G IJ SOLR
1.0000 g | Freq: Once | INTRAMUSCULAR | Status: AC
  Administered 2022-08-19: 1 g via INTRAVENOUS
  Filled 2022-08-19: qty 10

## 2022-08-19 NOTE — Subjective & Objective (Signed)
Patient came from [facility] for acute encephalopathy last time seen by family 3 days ago and first day at that time was alert and talking but today during the visit she was very tired and sleepy very slow to respond.  Noted to have hypotension blood pressure 96/40 satting 93% on room air Warm to the touch. Is on hemodialysis on Tuesday Thursday and Saturday Patient also endorsing some back pain

## 2022-08-19 NOTE — Assessment & Plan Note (Signed)
-   treat with Rocephin         await results of urine culture and adjust antibiotic coverage as needed  

## 2022-08-19 NOTE — Assessment & Plan Note (Signed)
Hold Coreg as patient is significantly hypotensive.  Hold Eliquis as patient is at high risk of bleeding given elevated INR in thrombocytopenia.

## 2022-08-19 NOTE — Assessment & Plan Note (Addendum)
Patient has history of cirrhosis which could have been contributed as well as very poor nutrition. At this point no indication for bleeding Can try low-dose vitamin K to see if there is any potential improvement Recheck INR in the morning

## 2022-08-19 NOTE — Assessment & Plan Note (Signed)
On hemodialysis Tuesday Thursday Saturday sent message to nephrology may need early dialysis pending fluid status

## 2022-08-19 NOTE — ED Provider Notes (Signed)
[facility] EMERGENCY DEPARTMENT Provider Note  CSN: [id number] Arrival date & time: 08/19/22 1446  Chief Complaint(s) Altered Mental Status  HPI Dorothy Cooper is a 72 y.o. female with PMH ESRD on dialysis Tuesday Thursday Saturday, HTN, cirrhosis currently living in a SNF who presents emergency department for evaluation of altered mental status, back pain.  Patient last seen by her family on Thursday, 08/16/2022 awake and talking normally.  Today she is more somnolent and will only intermittently answer questions.  Additional history unable to be obtained secondary to patient's altered mental status.   Past Medical History No past medical history on file. There are no problems to display for this patient.  Home Medication(s) Prior to Admission medications   Not on File                                                                                                                                    Past Surgical History  Family History No family history on file.  Social History Social History   Tobacco Use   Smoking status: Every Day    Types: Cigarettes   Smokeless tobacco: Never   Allergies Penicillins, Amlodipine, Aspirin, and Gabapentin  Review of Systems Review of Systems  Unable to perform ROS: Mental status change    Physical Exam Vital Signs  I have reviewed the triage vital signs BP (!) 97/48   Pulse 78   Temp 99.5 F (37.5 C) (Oral)   Resp 17   LMP  (LMP Unknown)   SpO2 100%   Physical Exam Vitals and nursing note reviewed.  Constitutional:      General: She is not in acute distress.    Appearance: She is well-developed.  HENT:     Head: Normocephalic and atraumatic.  Eyes:     Conjunctiva/sclera: Conjunctivae normal.  Cardiovascular:     Rate and Rhythm: Normal rate and regular rhythm.     Heart sounds: No murmur heard. Pulmonary:     Effort: Pulmonary effort is normal. No respiratory distress.     Breath sounds: Normal  breath sounds.  Abdominal:     Palpations: Abdomen is soft.     Tenderness: There is no abdominal tenderness.  Musculoskeletal:        General: Tenderness present. No swelling.     Cervical back: Neck supple.  Skin:    General: Skin is warm and dry.     Capillary Refill: Capillary refill takes less than 2 seconds.  Neurological:     Mental Status: She is alert. She is disoriented.  Psychiatric:        Mood and Affect: Mood normal.     ED Results and Treatments Labs (all labs ordered are listed, but only abnormal results are displayed) Labs Reviewed  CBC WITH DIFFERENTIAL/PLATELET - Abnormal; Notable for the following components:      Result Value   RBC 2.57 (*)  Hemoglobin 9.1 (*)    HCT 27.4 (*)    MCV 106.6 (*)    MCH 35.4 (*)    RDW 17.0 (*)    Platelets 26 (*)    Lymphs Abs 0.3 (*)    All other components within normal limits  COMPREHENSIVE METABOLIC PANEL - Abnormal; Notable for the following components:   Sodium 132 (*)    Chloride 92 (*)    Glucose, Bld 179 (*)    BUN 52 (*)    Creatinine, Ser 5.35 (*)    Total Protein 5.4 (*)    Albumin 2.1 (*)    AST 60 (*)    Total Bilirubin 2.2 (*)    GFR, Estimated 8 (*)    All other components within normal limits  URINALYSIS, ROUTINE W REFLEX MICROSCOPIC - Abnormal; Notable for the following components:   Hgb urine dipstick LARGE (*)    Bilirubin Urine MODERATE (*)    Ketones, ur 15 (*)    Protein, ur 100 (*)    Nitrite POSITIVE (*)    Leukocytes,Ua LARGE (*)    All other components within normal limits  MAGNESIUM - Abnormal; Notable for the following components:   Magnesium 2.9 (*)    All other components within normal limits  LACTIC ACID, PLASMA - Abnormal; Notable for the following components:   Lactic Acid, Venous 2.1 (*)    All other components within normal limits  LACTIC ACID, PLASMA - Abnormal; Notable for the following components:   Lactic Acid, Venous 2.6 (*)    All other components within normal  limits  URINE CULTURE  AMMONIA  URINALYSIS, MICROSCOPIC (REFLEX)  DIC (DISSEMINATED INTRAVASCULAR COAGULATION)PANEL  LACTATE DEHYDROGENASE  HAPTOGLOBIN  IRON AND TIBC  FERRITIN                                                                                                                          Radiology CT CHEST ABDOMEN PELVIS W CONTRAST  Result Date: 08/19/2022 CLINICAL DATA:  Sepsis. EXAM: CT CHEST, ABDOMEN, AND PELVIS WITH CONTRAST TECHNIQUE: Multidetector CT imaging of the chest, abdomen and pelvis was performed following the standard protocol during bolus administration of intravenous contrast. RADIATION DOSE REDUCTION: This exam was performed according to the departmental dose-optimization program which includes automated exposure control, adjustment of the mA and/or kV according to patient size and/or use of iterative reconstruction technique. CONTRAST:  64m OMNIPAQUE IOHEXOL 300 MG/ML  SOLN COMPARISON:  Chest radiograph earlier today. FINDINGS: CT CHEST FINDINGS Cardiovascular: Aortic atherosclerosis and tortuosity without aneurysm or acute aortic findings. No obvious central pulmonary embolus on this exam not tailored for pulmonary arteries S mint. Borderline cardiomegaly. There are coronary artery calcifications. No pericardial effusion. Mediastinum/Nodes: No mediastinal adenopathy or mass. No hilar adenopathy. Decompressed esophagus. No visible thyroid nodule. Lungs/Pleura: Motion artifact through the bases. Dependent atelectasis in the right greater than left lower lobe. No confluent consolidation. Mild central bronchial thickening without endobronchial lesion. No significant pleural effusion. Musculoskeletal: Remote  right proximal humerus fracture with posttraumatic deformity. Exaggerated thoracic kyphosis. The bones are diffusely under mineralized. Mild T6 compression deformity. No posterior cortex involvement. CT ABDOMEN PELVIS FINDINGS Hepatobiliary: Nodular hepatic contours  consistent with cirrhosis. There is no discrete focal hepatic lesion. Motion artifact through the liver limits assessment. Innumerable calcified gallstones. No definite pericholecystic inflammation. No biliary dilatation, although the common bile duct is poorly defined. Pancreas: No ductal dilatation or inflammation. Spleen: Splenomegaly with spleen measuring 15.1 x 6 x 13.4 cm (volume = 600 cm^3). Small subcapsular low-density posteriorly series 3, image 49, nonspecific. Adrenals/Urinary Tract: No adrenal nodule. Bilateral renal parenchymal atrophy. No hydronephrosis or focal renal lesion. Urinary bladder is obscured by streak artifact from left hip arthroplasty. 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 The stomach is decompressed. There is no small bowel obstruction or inflammation. Small to moderate volume of colonic stool without colonic inflammation. The appendix is not visualized. Vascular/Lymphatic: Moderate to advanced aortic and branch atherosclerosis. No aortic aneurysm. There is no evidence of portal vein thrombosis, although phase of contrast limits portal vein assessment. Left upper quadrant collaterals with splenorenal shunting. Reproductive: Uterus primarily obscured by streak artifact from left hip arthroplasty. There is no obvious adnexal mass. Other: Minimal perihepatic ascites. Mild generalized edema of the subcutaneous and intra-abdominal fat. No free air. Surgical staples in the anterior abdominal wall. Musculoskeletal: Bones diffusely under mineralized. Marked compression deformity of L2 and L3 with buckling of the posterior cortex. Moderate L5 compression fracture. There is a fracture through the superior aspect of L1 vertebral body that is age indeterminate. Left hip arthroplasty. IMPRESSION: 1. Hepatic cirrhosis. Splenomegaly and left upper quadrant collaterals and splenorenal shunting. Minimal perihepatic ascites. 2. Cholelithiasis without definite pericholecystic inflammation. 3.  Dependent atelectasis in both lungs.  No pneumonia. 4. Cardiomegaly.  Coronary artery calcifications. 5. Multiple thoracic and lumbar compression deformities. Marked compression deformity of L2 and L3 with buckling of the posterior cortex. Moderate L5 compression fracture. There is a fracture through the superior aspect of L1 vertebral body that is age indeterminate. Mild T6 compression fracture. Recommend correlation with focal tenderness. Aortic Atherosclerosis (ICD10-I70.0). Electronically Signed   By: Keith Rake M.D.   On: 08/19/2022 18:56   CT Head Wo Contrast  Result Date: 08/19/2022 CLINICAL DATA:  Mental status change, unknown cause. EXAM: CT HEAD WITHOUT CONTRAST TECHNIQUE: Contiguous axial images were obtained from the base of the skull through the vertex without intravenous contrast. RADIATION DOSE REDUCTION: This exam was performed according to the departmental dose-optimization program which includes automated exposure control, adjustment of the mA and/or kV according to patient size and/or use of iterative reconstruction technique. COMPARISON:  None Available. FINDINGS: Brain: No evidence of acute infarction, hemorrhage, hydrocephalus, extra-axial collection or mass lesion/mass effect. Prominence of the ventricles and sulci secondary to mild cerebral volume loss. Patchy areas of low-attenuation presumed chronic microvascular ischemic changes. Vascular: No hyperdense vessel or unexpected calcification. Skull: Normal. Negative for fracture or focal lesion. Sinuses/Orbits: No acute finding. Other: None. IMPRESSION: 1.  No acute intracranial abnormality. 2. Mild cerebral volume loss and chronic microvascular ischemic changes of the white matter. Electronically Signed   By: Keane Police D.O.   On: 08/19/2022 16:48   DG Chest Portable 1 View  Result Date: 08/19/2022 CLINICAL DATA:  Altered mental status EXAM: PORTABLE CHEST 1 VIEW COMPARISON:  Chest radiograph 05/01/2022 FINDINGS: The heart is  enlarged, unchanged. The upper mediastinal contours are stable. There is no focal consolidation or pulmonary edema. There is no pleural effusion or pneumothorax  There is no acute osseous abnormality. IMPRESSION: Unchanged cardiomegaly.  No focal consolidation or pleural effusion. Electronically Signed   By: Valetta Mole M.D.   On: 08/19/2022 16:25    Pertinent labs & imaging results that were available during my care of the patient were reviewed by me and considered in my medical decision making (see MDM for details).  Medications Ordered in ED Medications  lactated ringers bolus 500 mL (has no administration in time range)  cefTRIAXone (ROCEPHIN) 1 g in sodium chloride 0.9 % 100 mL IVPB (has no administration in time range)  lactated ringers bolus 1,000 mL (0 mLs Intravenous Stopped 08/19/22 1927)  iohexol (OMNIPAQUE) 300 MG/ML solution 80 mL (80 mLs Intravenous Contrast Given 08/19/22 1836)                                                                                                                                     Procedures .Critical Care  Performed by: Teressa Lower, MD Authorized by: Teressa Lower, MD   Critical care provider statement:    Critical care time (minutes):  30   Critical care was necessary to treat or prevent imminent or life-threatening deterioration of the following conditions:  Circulatory failure and dehydration   Critical care was time spent personally by me on the following activities:  Development of treatment plan with patient or surrogate, discussions with consultants, evaluation of patient's response to treatment, examination of patient, ordering and review of laboratory studies, ordering and review of radiographic studies, ordering and performing treatments and interventions, pulse oximetry, re-evaluation of patient's condition and review of old charts   (including critical care time)  Medical Decision Making / ED Course   This patient presents to the  ED for concern of altered mental status, back pain, this involves an extensive number of treatment options, and is a complaint that carries with it a high risk of complications and morbidity.  The differential diagnosis includes cystitis, toxic encephalopathy, metabolic encephalopathy, hepatic encephalopathy, pyelonephritis, fracture, polypharmacy  MDM: Seen in the emergency room for evaluation of altered mental status.  Physical exam with a very dry appearing patient with significant tenderness in the L-spine.  Patient is encephalopathic here in the emergency department.  Laboratory evaluation with hemoglobin of 9.1 with an MCV of 106.6 which is downtrending from previous.  Platelet count is 26 which is also downtrending from previous.  Hemolysis labs sent.  BUN is 52, creatinine 5.35 consistent with her ESRD.  Patient does make urine and urinalysis with positive nitrites, large leuk esterase and greater than 50 white blood cells, 21-50 red blood cells but there is also greater than 50 squamous epithelial cells.  CT head unremarkable, chest x-ray unremarkable.  CT chest abdomen pelvis with splenomegaly and hepatic cirrhosis, cholelithiasis with no acute evidence of cholecystitis, cardiomegaly and multiple supposedly new thoracic and lumbar compression fractures including L2, L3, L5, age-indeterminate L1 and a mild T6.  I spoke with the neurosurgeon on-call who states that this patient is not a surgical candidate and her only real options are TLSO bracing at this time.  Ceftriaxone initiated for the patient's UTI and on reevaluation, she continues to have softer blood pressures so an additional 500 cc bolus was given on top of her initial 1 L.  Lactate is elevated and suspect there is certainly an element of dehydration here but her encephalopathy remains undifferentiated at this time.  Patient require hospital admission as she is significant off of her baseline and patient admitted.   Additional history  obtained: -Additional history obtained from daughter -External records from outside source obtained and reviewed including: Chart review including previous notes, labs, imaging, consultation notes   Lab Tests: -I ordered, reviewed, and interpreted labs.   The pertinent results include:   Labs Reviewed  CBC WITH DIFFERENTIAL/PLATELET - Abnormal; Notable for the following components:      Result Value   RBC 2.57 (*)    Hemoglobin 9.1 (*)    HCT 27.4 (*)    MCV 106.6 (*)    MCH 35.4 (*)    RDW 17.0 (*)    Platelets 26 (*)    Lymphs Abs 0.3 (*)    All other components within normal limits  COMPREHENSIVE METABOLIC PANEL - Abnormal; Notable for the following components:   Sodium 132 (*)    Chloride 92 (*)    Glucose, Bld 179 (*)    BUN 52 (*)    Creatinine, Ser 5.35 (*)    Total Protein 5.4 (*)    Albumin 2.1 (*)    AST 60 (*)    Total Bilirubin 2.2 (*)    GFR, Estimated 8 (*)    All other components within normal limits  URINALYSIS, ROUTINE W REFLEX MICROSCOPIC - Abnormal; Notable for the following components:   Hgb urine dipstick LARGE (*)    Bilirubin Urine MODERATE (*)    Ketones, ur 15 (*)    Protein, ur 100 (*)    Nitrite POSITIVE (*)    Leukocytes,Ua LARGE (*)    All other components within normal limits  MAGNESIUM - Abnormal; Notable for the following components:   Magnesium 2.9 (*)    All other components within normal limits  LACTIC ACID, PLASMA - Abnormal; Notable for the following components:   Lactic Acid, Venous 2.1 (*)    All other components within normal limits  LACTIC ACID, PLASMA - Abnormal; Notable for the following components:   Lactic Acid, Venous 2.6 (*)    All other components within normal limits  URINE CULTURE  AMMONIA  URINALYSIS, MICROSCOPIC (REFLEX)  DIC (DISSEMINATED INTRAVASCULAR COAGULATION)PANEL  LACTATE DEHYDROGENASE  HAPTOGLOBIN  IRON AND TIBC  FERRITIN      EKG   EKG Interpretation  Date/Time:  Sunday August 19 2022 15:04:24  EDT Ventricular Rate:  67 PR Interval:  148 QRS Duration: 82 QT Interval:  396 QTC Calculation: 418 R Axis:   -2 Text Interpretation: Normal sinus rhythm When compared with ECG of 01-May-2022 10:01, PREVIOUS ECG IS PRESENT Confirmed by Donnita Farina ([id number]) on 08/19/2022 4:10:02 PM         Imaging Studies ordered: I ordered imaging studies including chest x-ray, CT head, CT chest abdomen pelvis I independently visualized and interpreted imaging. I agree with the radiologist interpretation   Medicines ordered and prescription drug management: Meds ordered this encounter  Medications   lactated ringers bolus 1,000 mL   iohexol (OMNIPAQUE) 300 MG/ML  solution 80 mL   lactated ringers bolus 500 mL   cefTRIAXone (ROCEPHIN) 1 g in sodium chloride 0.9 % 100 mL IVPB    Order Specific Question:   Antibiotic Indication:    Answer:   UTI    -I have reviewed the patients home medicines and have made adjustments as needed  Critical interventions Fluid resuscitation, antibiotics  Consultations Obtained: I requested consultation with the neurosurgeon on-call,  and discussed lab and imaging findings as well as pertinent plan - they recommend: TLSO bracing   Cardiac Monitoring: The patient was maintained on a cardiac monitor.  I personally viewed and interpreted the cardiac monitored which showed an underlying rhythm of: NSR  Social Determinants of Health:  Factors impacting patients care include: none   Reevaluation: After the interventions noted above, I reevaluated the patient and found that they have :stayed the same  Co morbidities that complicate the patient evaluation No past medical history on file.    Dispostion: I considered admission for this patient, and given persistent encephalopathy, urinary tract infection and dehydration, patient require hospital admission     Final Clinical Impression(s) / ED Diagnoses Final diagnoses:  None     '@PCDICTATION'$ @     Teressa Lower, MD 08/19/22 2012

## 2022-08-19 NOTE — ED Provider Triage Note (Signed)
Emergency Medicine Provider Triage Evaluation Note  Courtni Balash , a 72 y.o. female  was evaluated in triage.  Pt complains of altered mental status.  Patient was last seen by family on Thursday where patient was at her baseline.  Today she is somnolent, and confused.  No focal deficits.  Soft blood pressure with EMS.  Blood pressure 91/47 during my evaluation in triage.  Review of Systems  Positive: As above Negative: As above  Physical Exam  BP (!) 88/43 (BP Location: Right Arm)   Pulse 68   Temp 99.5 F (37.5 C) (Oral)   Resp 16   LMP  (LMP Unknown)   SpO2 93%  Gen:   Awake, no distress   Resp:  Normal effort  MSK:   Moves extremities without difficulty  Other:    Medical Decision Making  Medically screening exam initiated at 3:10 PM.  Appropriate orders placed.  Alyssa Rotondo was informed that the remainder of the evaluation will be completed by another provider, this initial triage assessment does not replace that evaluation, and the importance of remaining in the ED until their evaluation is complete.     Evlyn Courier, PA-C 08/19/22 1511

## 2022-08-19 NOTE — Assessment & Plan Note (Signed)
As per family patient has not had GI evaluation or not that they aware of.  Overall appears to have fairly poor prognosis with MELD score 40. At this point family would like to see how patient does overnight if she improves this could be further investigated but overall given progressive decline and poor prognosis if patient deteriorates family would consider comfort care.

## 2022-08-19 NOTE — ED Provider Notes (Deleted)
Ultrasound ED Peripheral IV (Provider)  Date/Time: 08/19/2022 8:15 PM  Performed by: Tedd Sias, PA Authorized by: Tedd Sias, PA   Procedure details:    Indications: hypotension and multiple failed IV attempts     Skin Prep: chlorhexidine gluconate     Location:  Left AC   Angiocath:  20 G   Bedside Ultrasound Guided: Yes     Images: not archived     Patient tolerated procedure without complications: Yes     Dressing applied: Yes       Tedd Sias, PA 08/19/22 2016

## 2022-08-19 NOTE — Assessment & Plan Note (Addendum)
-   most likely multifactorial secondary to combination of  Infection (UTI)   dehydration secondary to decreased by mouth intake,   polypharmacy   - Will rehydrate using albumin  - treat underlining infection   - Hold contributing medications  Gust with family possibility of getting an MRI at this point patient would not be a good candidate family would like to avoid over aggressive intervention patient would not be candidate for any aggressive interventions procedures or medications secondary to severe thrombocytopenia and elevated INR      - neurological exam appears to be nonfocal but patient unable to cooperate fully   - VBG unremarkable no evidence of hypercarbia    -  history of liver disease but ammonia unremarkable Plan to gently rehydrate treat underlying infection and monitor status patient already showing some signs of improvement

## 2022-08-19 NOTE — Assessment & Plan Note (Addendum)
At this point appears to be dehydrated fluid down. Fluid management per nephrology Sent message to Dr. Jonnie Finner of patient has been admitted

## 2022-08-19 NOTE — H&P (Signed)
Dorothy Cooper FTD:[id number] DOB: Dec 28, 1950 DOA: 08/19/2022     PCP: Merryl Hacker, No   Outpatient Specialists:   Nephrology   Oncology Kendell Bane, MD    [facility]  Patient arrived to ER on 08/19/22 at 1446 Referred by Attending Kommor, Debe Coder, MD   Patient coming from:     From facility [facility]  Chief Complaint:   Chief Complaint  Patient presents with   Altered Mental Status    HPI: Dorothy Cooper is a 72 y.o. female with medical history significant of end-stage renal disease on hemodialysis Tuesday Thursday Saturday, thrombocytopenia, cirrhosis  unclear reason, diastolic CHF, chronic anemia, asthma, COPD, DM2, glaucoma, HLD, HTN pancytopenia  On eliquis for A.fib migraine headaches  Presented with confusion Patient came from [facility] for acute encephalopathy last time seen by family 3 days ago and first day at that time was alert and talking but today during the visit she was very tired and sleepy very slow to respond.  Noted to have hypotension blood pressure 96/40 satting 93% on room air Warm to the touch. Is on hemodialysis on Tuesday Thursday and Saturday Patient also endorsing some back pain Patient has long known history of splenomegaly and liver disease although unclear if cause has been ever elucidated she has been seeing hematologist in the past for thrombocytopenia felt to not have any evidence of DIC or TTP of hemolysis although component of ITP could not be ruled out Possible MDS  Pt moved to [place] few 3-4 month ago  She did get her HD on Saturday yesterday  Used to smoke and drink last ETOH in her 61 ties  Family unsure why she has cirrhosis  She does not have a GI doctor      Regarding pertinent Chronic problems:     Hyperlipidemia -  on statins Mevacor     HTN on Coreg Action used to take midodrine 3 times a day   chronic CHF diastolic/systolic/ combined -     DM 2 -  diet controlled     Asthma -well   controlled on home  inhalers/ nebs     COPD - not  followed by pulmonology   not  on baseline oxygen        A. Fib -  - CHA2DS2 vas score>3   current  on anticoagulation with  Eliquis,           -  Rate control:  Currently controlled with  Coreg   End-stage renal disease on hemodialysis Tuesday Thursday Saturday  Lab Results  Component Value Date   CREATININE 5.35 (H) 08/19/2022   CREATININE 4.18 (H) 05/01/2022       Liver disease MELD 3.0: 40 at 08/19/2022        Chronic anemia - baseline hg Hemoglobin & Hematocrit  Recent Labs    05/01/22 1029 08/19/22 1515  HGB 10.2* 9.1*     While in ER:   UA concerning for UTI initial lactic acid up to 2.6 improved to 2.1 Started on Rocephin given 1 L lactated Ringer's CT scan showed multiple compression fractures ER spoke to neurosurgery who felt the patient not a surgical candidate may benefit from TLSO brace Ordered Ammonia 28  CT HEAD   NON acute  CXR -  NON acute  CTabd/pelvis -cirrhosis splenomegaly cardiomegaly compression deformity of L2-L3 as well as moderate L5 compression fracture T6 compression fracture    Following Medications were ordered in ER: Medications  lactated ringers  bolus 500 mL (has no administration in time range)  cefTRIAXone (ROCEPHIN) 1 g in sodium chloride 0.9 % 100 mL IVPB (has no administration in time range)  lactated ringers bolus 1,000 mL (0 mLs Intravenous Stopped 08/19/22 1927)  iohexol (OMNIPAQUE) 300 MG/ML solution 80 mL (80 mLs Intravenous Contrast Given 08/19/22 1836)    _______________________________________________________ ER Provider Called: Neurosurgery     They Recommend admit to medicine   May benefit from TLSO brace  Discussed with hematology  DIC panel doubt true DIC   ED Triage Vitals [08/19/22 1500]  Enc Vitals Group     BP (!) 88/43     Pulse Rate 68     Resp 16     Temp 99.5 F (37.5 C)     Temp Source Oral     SpO2 93 %     Weight      Height      Head Circumference      Peak  Flow      Pain Score      Pain Loc      Pain Edu?      Excl. in [place]?   CXKG(81)@     _________________________________________ Significant initial  Findings: Abnormal Labs Reviewed  CBC WITH DIFFERENTIAL/PLATELET - Abnormal; Notable for the following components:      Result Value   RBC 2.57 (*)    Hemoglobin 9.1 (*)    HCT 27.4 (*)    MCV 106.6 (*)    MCH 35.4 (*)    RDW 17.0 (*)    Platelets 26 (*)    Lymphs Abs 0.3 (*)    All other components within normal limits  COMPREHENSIVE METABOLIC PANEL - Abnormal; Notable for the following components:   Sodium 132 (*)    Chloride 92 (*)    Glucose, Bld 179 (*)    BUN 52 (*)    Creatinine, Ser 5.35 (*)    Total Protein 5.4 (*)    Albumin 2.1 (*)    AST 60 (*)    Total Bilirubin 2.2 (*)    GFR, Estimated 8 (*)    All other components within normal limits  URINALYSIS, ROUTINE W REFLEX MICROSCOPIC - Abnormal; Notable for the following components:   Hgb urine dipstick LARGE (*)    Bilirubin Urine MODERATE (*)    Ketones, ur 15 (*)    Protein, ur 100 (*)    Nitrite POSITIVE (*)    Leukocytes,Ua LARGE (*)    All other components within normal limits  MAGNESIUM - Abnormal; Notable for the following components:   Magnesium 2.9 (*)    All other components within normal limits  LACTIC ACID, PLASMA - Abnormal; Notable for the following components:   Lactic Acid, Venous 2.1 (*)    All other components within normal limits  LACTIC ACID, PLASMA - Abnormal; Notable for the following components:   Lactic Acid, Venous 2.6 (*)    All other components within normal limits  DIC (DISSEMINATED INTRAVASCULAR COAGULATION)PANEL - Abnormal; Notable for the following components:   Prothrombin Time 62.8 (*)    INR 7.4 (*)    aPTT 62 (*)    D-Dimer, Quant 3.19 (*)    Platelets 21 (*)    All other components within normal limits    ECG: Ordered Personally reviewed and interpreted by me showing: HR : 67 Rhythm:  NSR,    no evidence of ischemic  changes QTC  418    The recent clinical data  is shown below. Vitals:   08/19/22 1945 08/19/22 2000 08/19/22 2015 08/19/22 2030  BP: (!) 97/48 (!) 109/46 (!) 98/49 (!) 102/44  Pulse: 78 77 74 73  Resp: '17 16 17 18  '$ Temp:      TempSrc:      SpO2: 100% 95% 96% 94%      WBC     Component Value Date/Time   WBC 4.7 08/19/2022 1515   LYMPHSABS 0.3 (L) 08/19/2022 1515   MONOABS 0.3 08/19/2022 1515   EOSABS 0.0 08/19/2022 1515   BASOSABS 0.0 08/19/2022 1515     Lactic Acid, Venous    Component Value Date/Time   LATICACIDVEN 2.6 (HH) 08/19/2022 1812       UA  evidence of UTI      Urine analysis:    Component Value Date/Time   COLORURINE YELLOW 08/19/2022 1505   APPEARANCEUR CLEAR 08/19/2022 1505   LABSPEC 1.020 08/19/2022 1505   PHURINE 6.5 08/19/2022 1505   GLUCOSEU NEGATIVE 08/19/2022 1505   HGBUR LARGE (A) 08/19/2022 1505   BILIRUBINUR MODERATE (A) 08/19/2022 1505   KETONESUR 15 (A) 08/19/2022 1505   PROTEINUR 100 (A) 08/19/2022 1505   NITRITE POSITIVE (A) 08/19/2022 1505   LEUKOCYTESUR LARGE (A) 08/19/2022 1505    No results found for this or any previous visit.   _______________________________________________ Hospitalist was called for admission for acute encephalopathy and UTI     The following Work up has been ordered so far:  Orders Placed This Encounter  Procedures   Critical Care   ED FAST [place] BEDSIDE   Urine Culture   CT Head Wo Contrast   DG Chest Portable 1 View   CT CHEST ABDOMEN PELVIS W CONTRAST   CBC with Differential   Comprehensive metabolic panel   Urinalysis, Routine w reflex microscopic   Magnesium   Lactic acid, plasma   Ammonia   DIC Panel ONCE - STAT   Lactate dehydrogenase   Haptoglobin   Iron and TIBC   Ferritin (Iron Binding Protein)   Urinalysis, Microscopic (reflex)   In and Out Cath   Apply TLSO brace   Maintain TLSO brace   Consult to neurosurgery   Consult for [facility] Admission   EKG 12-Lead      OTHER Significant initial  Findings:  labs showing:    Recent Labs  Lab 08/19/22 1515  NA 132*  K 4.1  CO2 28  GLUCOSE 179*  BUN 52*  CREATININE 5.35*  CALCIUM 10.1  MG 2.9*    Cr  Up from baseline see below Lab Results  Component Value Date   CREATININE 5.35 (H) 08/19/2022   CREATININE 4.18 (H) 05/01/2022    Recent Labs  Lab 08/19/22 1515  AST 60*  ALT 33  ALKPHOS 75  BILITOT 2.2*  PROT 5.4*  ALBUMIN 2.1*   Lab Results  Component Value Date   CALCIUM 10.1 08/19/2022    Plt: Lab Results  Component Value Date   PLT 21 (LL) 08/19/2022    COVID-19 Labs  Recent Labs    08/19/22 1855  DDIMER 3.19*  LDH 181       Venous  Blood Gas result:  pH    7.44 High  Acid-Base Excess 9.4 High  mmol/L  pCO2, Ven 52 mmHg O2 Saturation 78.4 %  pO2, Ven 47 High  mmHg        Recent Labs  Lab 08/19/22 1515 08/19/22 1855  WBC 4.7  --   NEUTROABS 4.1  --  HGB 9.1*  --   HCT 27.4*  --   MCV 106.6*  --   PLT 26* 21*    HG/HCT Down     Component Value Date/Time   HGB 9.1 (L) 08/19/2022 1515   HCT 27.4 (L) 08/19/2022 1515   MCV 106.6 (H) 08/19/2022 1515      Recent Labs  Lab 08/19/22 1812  AMMONIA 27      Cardiac Panel (last 3 results) No results for input(s): "CKTOTAL", "CKMB", "TROPONINI", "RELINDX" in the last 72 hours.  .car BNP (last 3 results) Recent Labs    05/01/22 1029  BNP 446.1*      DM  labs:  HbA1C: No results for input(s): "HGBA1C" in the last 8760 hours.     CBG (last 3)  No results for input(s): "GLUCAP" in the last 72 hours.    Cultures: No results found for: "SDES", "SPECREQUEST", "CULT", "REPTSTATUS"   Radiological Exams on Admission: CT CHEST ABDOMEN PELVIS W CONTRAST  Result Date: 08/19/2022 CLINICAL DATA:  Sepsis. EXAM: CT CHEST, ABDOMEN, AND PELVIS WITH CONTRAST TECHNIQUE: Multidetector CT imaging of the chest, abdomen and pelvis was performed following the standard protocol during bolus administration of  intravenous contrast. RADIATION DOSE REDUCTION: This exam was performed according to the departmental dose-optimization program which includes automated exposure control, adjustment of the mA and/or kV according to patient size and/or use of iterative reconstruction technique. CONTRAST:  28m OMNIPAQUE IOHEXOL 300 MG/ML  SOLN COMPARISON:  Chest radiograph earlier today. FINDINGS: CT CHEST FINDINGS Cardiovascular: Aortic atherosclerosis and tortuosity without aneurysm or acute aortic findings. No obvious central pulmonary embolus on this exam not tailored for pulmonary arteries S mint. Borderline cardiomegaly. There are coronary artery calcifications. No pericardial effusion. Mediastinum/Nodes: No mediastinal adenopathy or mass. No hilar adenopathy. Decompressed esophagus. No visible thyroid nodule. Lungs/Pleura: Motion artifact through the bases. Dependent atelectasis in the right greater than left lower lobe. No confluent consolidation. Mild central bronchial thickening without endobronchial lesion. No significant pleural effusion. Musculoskeletal: Remote right proximal humerus fracture with posttraumatic deformity. Exaggerated thoracic kyphosis. The bones are diffusely under mineralized. Mild T6 compression deformity. No posterior cortex involvement. CT ABDOMEN PELVIS FINDINGS Hepatobiliary: Nodular hepatic contours consistent with cirrhosis. There is no discrete focal hepatic lesion. Motion artifact through the liver limits assessment. Innumerable calcified gallstones. No definite pericholecystic inflammation. No biliary dilatation, although the common bile duct is poorly defined. Pancreas: No ductal dilatation or inflammation. Spleen: Splenomegaly with spleen measuring 15.1 x 6 x 13.4 cm (volume = 600 cm^3). Small subcapsular low-density posteriorly series 3, image 49, nonspecific. Adrenals/Urinary Tract: No adrenal nodule. Bilateral renal parenchymal atrophy. No hydronephrosis or focal renal lesion. Urinary  bladder is obscured by streak artifact from left hip arthroplasty. 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 The stomach is decompressed. There is no small bowel obstruction or inflammation. Small to moderate volume of colonic stool without colonic inflammation. The appendix is not visualized. Vascular/Lymphatic: Moderate to advanced aortic and branch atherosclerosis. No aortic aneurysm. There is no evidence of portal vein thrombosis, although phase of contrast limits portal vein assessment. Left upper quadrant collaterals with splenorenal shunting. Reproductive: Uterus primarily obscured by streak artifact from left hip arthroplasty. There is no obvious adnexal mass. Other: Minimal perihepatic ascites. Mild generalized edema of the subcutaneous and intra-abdominal fat. No free air. Surgical staples in the anterior abdominal wall. Musculoskeletal: Bones diffusely under mineralized. Marked compression deformity of L2 and L3 with buckling of the posterior cortex. Moderate L5 compression fracture. There is a fracture through  the superior aspect of L1 vertebral body that is age indeterminate. Left hip arthroplasty. IMPRESSION: 1. Hepatic cirrhosis. Splenomegaly and left upper quadrant collaterals and splenorenal shunting. Minimal perihepatic ascites. 2. Cholelithiasis without definite pericholecystic inflammation. 3. Dependent atelectasis in both lungs.  No pneumonia. 4. Cardiomegaly.  Coronary artery calcifications. 5. Multiple thoracic and lumbar compression deformities. Marked compression deformity of L2 and L3 with buckling of the posterior cortex. Moderate L5 compression fracture. There is a fracture through the superior aspect of L1 vertebral body that is age indeterminate. Mild T6 compression fracture. Recommend correlation with focal tenderness. Aortic Atherosclerosis (ICD10-I70.0). Electronically Signed   By: Keith Rake M.D.   On: 08/19/2022 18:56   CT Head Wo Contrast  Result Date:  08/19/2022 CLINICAL DATA:  Mental status change, unknown cause. EXAM: CT HEAD WITHOUT CONTRAST TECHNIQUE: Contiguous axial images were obtained from the base of the skull through the vertex without intravenous contrast. RADIATION DOSE REDUCTION: This exam was performed according to the departmental dose-optimization program which includes automated exposure control, adjustment of the mA and/or kV according to patient size and/or use of iterative reconstruction technique. COMPARISON:  None Available. FINDINGS: Brain: No evidence of acute infarction, hemorrhage, hydrocephalus, extra-axial collection or mass lesion/mass effect. Prominence of the ventricles and sulci secondary to mild cerebral volume loss. Patchy areas of low-attenuation presumed chronic microvascular ischemic changes. Vascular: No hyperdense vessel or unexpected calcification. Skull: Normal. Negative for fracture or focal lesion. Sinuses/Orbits: No acute finding. Other: None. IMPRESSION: 1.  No acute intracranial abnormality. 2. Mild cerebral volume loss and chronic microvascular ischemic changes of the white matter. Electronically Signed   By: Keane Police D.O.   On: 08/19/2022 16:48   DG Chest Portable 1 View  Result Date: 08/19/2022 CLINICAL DATA:  Altered mental status EXAM: PORTABLE CHEST 1 VIEW COMPARISON:  Chest radiograph 05/01/2022 FINDINGS: The heart is enlarged, unchanged. The upper mediastinal contours are stable. There is no focal consolidation or pulmonary edema. There is no pleural effusion or pneumothorax There is no acute osseous abnormality. IMPRESSION: Unchanged cardiomegaly.  No focal consolidation or pleural effusion. Electronically Signed   By: Valetta Mole M.D.   On: 08/19/2022 16:25   _______________________________________________________________________________________________________ Latest   Blood pressure (!) 102/44, pulse 73, temperature 99.5 F (37.5 C), temperature source Oral, resp. rate 18, SpO2 94 %.    Vitals  labs and radiology finding personally reviewed  Review of Systems:    Pertinent positives include: confusion  Constitutional:  No weight loss, night sweats, Fevers, chills, fatigue, weight loss  HEENT:  No headaches, Difficulty swallowing,Tooth/dental problems,Sore throat,  No sneezing, itching, ear ache, nasal congestion, post nasal drip,  Cardio-vascular:  No chest pain, Orthopnea, PND, anasarca, dizziness, palpitations.no Bilateral lower extremity swelling  GI:  No heartburn, indigestion, abdominal pain, nausea, vomiting, diarrhea, change in bowel habits, loss of appetite, melena, blood in stool, hematemesis Resp:  no shortness of breath at rest. No dyspnea on exertion, No excess mucus, no productive cough, No non-productive cough, No coughing up of blood.No change in color of mucus.No wheezing. Skin:  no rash or lesions. No jaundice GU:  no dysuria, change in color of urine, no urgency or frequency. No straining to urinate.  No flank pain.  Musculoskeletal:  No joint pain or no joint swelling. No decreased range of motion. No back pain.  Psych:  No change in mood or affect. No depression or anxiety. No memory loss.  Neuro: no localizing neurological complaints, no tingling, no weakness, no double vision,  no gait abnormality, no slurred speech,   All systems reviewed and apart from [place] all are negative _______________________________________________________________________________________________ Past Medical History:   Past Medical History:  Diagnosis Date   Anemia    CHF (congestive heart failure) ([place])    Chronic kidney disease    Diabetes mellitus without complication ([place])    Dysrhythmia    Hypertension        Social History:  Ambulatory   wheelchair bound,      reports that she has quit smoking. Her smoking use included cigarettes. She has never used smokeless tobacco. She reports that she does not currently use alcohol. She reports that she does not  use drugs.     Family History:DM2 HTN  ______________________________________________________________________________________________ Allergies: Allergies  Allergen Reactions   Penicillins Anaphylaxis   Amlodipine    Aspirin    Gabapentin      Prior to Admission medications   Not on File    ___________________________________________________________________________________________________ Physical Exam:    08/19/2022    8:30 PM 08/19/2022    8:15 PM 08/19/2022    8:00 PM  Vitals with BMI  Systolic 161 98 096  Diastolic 44 49 46  Pulse 73 74 77      1. General:  in No  Acute distress   Chronically ill  -appearing 2. Psychological: Alert but not Oriented 3. Head/ENT:    Dry Mucous Membranes                          Head Non traumatic, neck supple                           Poor Dentition 4. SKIN:  decreased Skin turgor,  Skin clean Dry  , pressure ulcer over left heal and left buttock 5. Heart: Regular rate and rhythm no  Murmur, no Rub or gallop 6. Lungs:  Clear to auscultation bilaterally, no wheezes or crackles   7. Abdomen: Soft,  non-tender, Non distended bowel sounds present 8. Lower extremities: no clubbing, cyanosis, no  edema 9. Neurologically Grossly intact, moving all 4 extremities equally   10. MSK: Normal range of motion    Chart has been reviewed  ______________________________________________________________________________________________  Assessment/Plan 72 y.o. female with medical history significant of end-stage renal disease on hemodialysis Tuesday Thursday Saturday, thrombocytopenia, cirrhosis  unclear reason, diastolic CHF, chronic anemia, asthma, COPD, DM2, glaucoma, HLD, HTN pancytopenia  On eliquis for A.fib migraine headaches  Admitted for   Altered mental status, unspecified altered mental status type     Present on Admission:  Acute metabolic encephalopathy  UTI (urinary tract infection)  Cirrhosis ([place])  Paroxysmal atrial  fibrillation (HCC)  Elevated INR  Chronic diastolic CHF (congestive heart failure) (HCC)  Hypotension  ESRD (end stage renal disease) (HCC)  Thrombocytopenia ([place])     UTI (urinary tract infection)  - treat with Rocephin         await results of urine culture and adjust antibiotic coverage as needed   Acute metabolic encephalopathy   - most likely multifactorial secondary to combination of  Infection (UTI)   dehydration secondary to decreased by mouth intake,   polypharmacy   - Will rehydrate using albumin  - treat underlining infection   - Hold contributing medications  Gust with family possibility of getting an MRI at this point patient would not be a good candidate family would like to avoid over aggressive intervention patient would  not be candidate for any aggressive interventions procedures or medications secondary to severe thrombocytopenia and elevated INR      - neurological exam appears to be nonfocal but patient unable to cooperate fully   - VBG unremarkable no evidence of hypercarbia    -  history of liver disease but ammonia unremarkable Plan to gently rehydrate treat underlying infection and monitor status patient already showing some signs of improvement   Cirrhosis ([place]) As per family patient has not had GI evaluation or not that they aware of.  Overall appears to have fairly poor prognosis with MELD score 40. At this point family would like to see how patient does overnight if she improves this could be further investigated but overall given progressive decline and poor prognosis if patient deteriorates family would consider comfort care.   DM2 (diabetes mellitus, type 2) (HCC) Order sliding scale   Paroxysmal atrial fibrillation (HCC) Hold Coreg as patient is significantly hypotensive.  Hold Eliquis as patient is at high risk of bleeding given elevated INR in thrombocytopenia.   Elevated INR Patient has history of cirrhosis which could have been contributed  as well as very poor nutrition. At this point no indication for bleeding Can try low-dose vitamin K to see if there is any potential improvement Recheck INR in the morning  Chronic diastolic CHF (congestive heart failure) (HCC) At this point appears to be dehydrated fluid down. Fluid management per nephrology Sent message to Dr. Jonnie Finner of patient has been admitted  Hypotension Restart midodrine and patient able to tolerate p.o.  ESRD (end stage renal disease) ([place]) On hemodialysis Tuesday Thursday Saturday sent message to nephrology may need early dialysis pending fluid status  Thrombocytopenia ([place]) Chronic patient was supposed to have father follow-up with hematology oncology on Wednesday at some point there was consideration if she may have ITP At this point no indication for platelet transfusion no acute bleeding. ER discussed case with hematology who felt there was no DIC or TTP We will DC Eliquis as patient is at high risk for bleeding   Other plan as per orders.  DVT prophylaxis:  SCD    Code Status:    Code Status: Not on file FULL CODE  as per  family  I had personally discussed CODE STATUS with patient/family    Family Communication:   Family  at  Bedside  plan of care was discussed   with GrandDaughter   Disposition Plan:                              Back to current facility when stable                            Following barriers for discharge:                                                   Will need consultants to evaluate patient prior to discharge                      Would benefit from PT/OT eval prior to DC  Ordered                   Swallow eval - SLP ordered  Transition of care consulted                   Nutrition    consulted                  Wound care  consulted                   Palliative care    consulted                                   Consults called: nephrology is aware, may benefit from  hematology consult in AM   Admission status:  ED Disposition     ED Disposition  [place]: [place] [[id number]]  Level of Care: Progressive [102]  Admit to Progressive based on following criteria: ACUTE MENTAL DISORDER-RELATED Drug/Alcohol Ingestion/Overdose/Withdrawal, Suicidal Ideation/attempt requiring safety sitter and < Q2h monitoring/assessments, moderate to severe agitation that is managed with medication/sitter, CIWA-Ar score < 20.  May place patient in observation at [facility] or [place] if equivalent level of care is available:: No  Covid Evaluation: Asymptomatic - no recent exposure (last 10 days) testing not required  Diagnosis: Acute metabolic encephalopathy [[id number]]  Admitting Physician: Toy Baker [[id number]]  Attending Physician: Toy Baker [[id number]]          Obs     Level of care       progressive tele indefinitely please discontinue once patient no longer qualifies      Herma Uballe 08/19/2022, 11:47 PM    [facility]     after 2 AM please page floor coverage PA If 7AM-7PM, please contact the day team taking care of the patient using [email or web address]   Patient was evaluated in the context of the global COVID-19 pandemic, which necessitated consideration that the patient might be at risk for infection with the SARS-CoV-2 virus that causes COVID-19. Institutional protocols and algorithms that pertain to the evaluation of patients at risk for COVID-19 are in a state of rapid change based on information released by regulatory bodies including the CDC and federal and state organizations. These policies and algorithms were followed during the patient's care.

## 2022-08-19 NOTE — Assessment & Plan Note (Signed)
Order sliding scale  

## 2022-08-19 NOTE — ED Notes (Signed)
Hospital bed ordered.

## 2022-08-19 NOTE — Assessment & Plan Note (Signed)
Restart midodrine and patient able to tolerate p.o.

## 2022-08-19 NOTE — Assessment & Plan Note (Signed)
Multiple vertebral compression fractures Hold off on p.o. Dilaudid as patient unable to tolerate p.o. given hypotension and confusion.  As needed may use Fentanyl to see if that would help with avoiding hypotension. Appreciate palliative care consult Neurosurgery was consulted patient not a surgical candidate.  Recommended TLSO brace.

## 2022-08-19 NOTE — Assessment & Plan Note (Signed)
Chronic patient was supposed to have father follow-up with hematology oncology on Wednesday at some point there was consideration if she may have ITP At this point no indication for platelet transfusion no acute bleeding. ER discussed case with hematology who felt there was no DIC or TTP We will DC Eliquis as patient is at high risk for bleeding

## 2022-08-19 NOTE — ED Triage Notes (Signed)
Pt arrives via [facility] from [facility] for AMS. Family saw pt on Thursday, she was alert and talking. Today they went to visit and she was more tired than normal, not speaking much and is slow to follow commands. Pt is warm to touch. 96/40, 93% RA, 72HR.

## 2022-08-20 ENCOUNTER — Encounter (HOSPITAL_COMMUNITY): Payer: Self-pay | Admitting: Internal Medicine

## 2022-08-20 ENCOUNTER — Observation Stay (HOSPITAL_COMMUNITY): Payer: Medicare Other

## 2022-08-20 DIAGNOSIS — K7210 Chronic hepatic failure without coma: Secondary | ICD-10-CM | POA: Diagnosis present

## 2022-08-20 DIAGNOSIS — Z515 Encounter for palliative care: Secondary | ICD-10-CM

## 2022-08-20 DIAGNOSIS — R64 Cachexia: Secondary | ICD-10-CM | POA: Diagnosis present

## 2022-08-20 DIAGNOSIS — D61818 Other pancytopenia: Secondary | ICD-10-CM | POA: Diagnosis present

## 2022-08-20 DIAGNOSIS — G9341 Metabolic encephalopathy: Secondary | ICD-10-CM | POA: Diagnosis not present

## 2022-08-20 DIAGNOSIS — M4856XA Collapsed vertebra, not elsewhere classified, lumbar region, initial encounter for fracture: Secondary | ICD-10-CM | POA: Diagnosis present

## 2022-08-20 DIAGNOSIS — I480 Paroxysmal atrial fibrillation: Secondary | ICD-10-CM | POA: Diagnosis present

## 2022-08-20 DIAGNOSIS — D684 Acquired coagulation factor deficiency: Secondary | ICD-10-CM | POA: Diagnosis present

## 2022-08-20 DIAGNOSIS — R4182 Altered mental status, unspecified: Secondary | ICD-10-CM | POA: Diagnosis present

## 2022-08-20 DIAGNOSIS — I5030 Unspecified diastolic (congestive) heart failure: Secondary | ICD-10-CM

## 2022-08-20 DIAGNOSIS — N186 End stage renal disease: Secondary | ICD-10-CM | POA: Diagnosis present

## 2022-08-20 DIAGNOSIS — Z66 Do not resuscitate: Secondary | ICD-10-CM | POA: Diagnosis present

## 2022-08-20 DIAGNOSIS — D539 Nutritional anemia, unspecified: Secondary | ICD-10-CM

## 2022-08-20 DIAGNOSIS — E11649 Type 2 diabetes mellitus with hypoglycemia without coma: Secondary | ICD-10-CM | POA: Diagnosis not present

## 2022-08-20 DIAGNOSIS — Z7189 Other specified counseling: Secondary | ICD-10-CM | POA: Diagnosis not present

## 2022-08-20 DIAGNOSIS — M4854XA Collapsed vertebra, not elsewhere classified, thoracic region, initial encounter for fracture: Secondary | ICD-10-CM | POA: Diagnosis present

## 2022-08-20 DIAGNOSIS — Z1612 Extended spectrum beta lactamase (ESBL) resistance: Secondary | ICD-10-CM | POA: Diagnosis present

## 2022-08-20 DIAGNOSIS — D631 Anemia in chronic kidney disease: Secondary | ICD-10-CM | POA: Diagnosis present

## 2022-08-20 DIAGNOSIS — I132 Hypertensive heart and chronic kidney disease with heart failure and with stage 5 chronic kidney disease, or end stage renal disease: Secondary | ICD-10-CM

## 2022-08-20 DIAGNOSIS — K7682 Hepatic encephalopathy: Secondary | ICD-10-CM | POA: Diagnosis present

## 2022-08-20 DIAGNOSIS — N390 Urinary tract infection, site not specified: Secondary | ICD-10-CM

## 2022-08-20 DIAGNOSIS — S32000A Wedge compression fracture of unspecified lumbar vertebra, initial encounter for closed fracture: Secondary | ICD-10-CM | POA: Diagnosis not present

## 2022-08-20 DIAGNOSIS — G928 Other toxic encephalopathy: Secondary | ICD-10-CM | POA: Diagnosis present

## 2022-08-20 DIAGNOSIS — I4891 Unspecified atrial fibrillation: Secondary | ICD-10-CM

## 2022-08-20 DIAGNOSIS — I5042 Chronic combined systolic (congestive) and diastolic (congestive) heart failure: Secondary | ICD-10-CM | POA: Diagnosis present

## 2022-08-20 DIAGNOSIS — J449 Chronic obstructive pulmonary disease, unspecified: Secondary | ICD-10-CM

## 2022-08-20 DIAGNOSIS — R638 Other symptoms and signs concerning food and fluid intake: Secondary | ICD-10-CM

## 2022-08-20 DIAGNOSIS — I5032 Chronic diastolic (congestive) heart failure: Secondary | ICD-10-CM | POA: Diagnosis not present

## 2022-08-20 DIAGNOSIS — G9340 Encephalopathy, unspecified: Secondary | ICD-10-CM

## 2022-08-20 DIAGNOSIS — E1122 Type 2 diabetes mellitus with diabetic chronic kidney disease: Secondary | ICD-10-CM | POA: Diagnosis present

## 2022-08-20 DIAGNOSIS — Z789 Other specified health status: Secondary | ICD-10-CM

## 2022-08-20 DIAGNOSIS — Z87891 Personal history of nicotine dependence: Secondary | ICD-10-CM

## 2022-08-20 DIAGNOSIS — R161 Splenomegaly, not elsewhere classified: Secondary | ICD-10-CM

## 2022-08-20 DIAGNOSIS — E119 Type 2 diabetes mellitus without complications: Secondary | ICD-10-CM | POA: Diagnosis present

## 2022-08-20 DIAGNOSIS — D696 Thrombocytopenia, unspecified: Secondary | ICD-10-CM | POA: Diagnosis not present

## 2022-08-20 DIAGNOSIS — K7460 Unspecified cirrhosis of liver: Secondary | ICD-10-CM | POA: Diagnosis present

## 2022-08-20 DIAGNOSIS — I959 Hypotension, unspecified: Secondary | ICD-10-CM | POA: Diagnosis present

## 2022-08-20 LAB — CBC WITH DIFFERENTIAL/PLATELET
Abs Immature Granulocytes: 0.04 10*3/uL (ref 0.00–0.07)
Basophils Absolute: 0 10*3/uL (ref 0.0–0.1)
Basophils Relative: 0 %
Eosinophils Absolute: 0 10*3/uL (ref 0.0–0.5)
Eosinophils Relative: 0 %
HCT: 24 % — ABNORMAL LOW (ref 36.0–46.0)
Hemoglobin: 7.9 g/dL — ABNORMAL LOW (ref 12.0–15.0)
Immature Granulocytes: 1 %
Lymphocytes Relative: 6 %
Lymphs Abs: 0.2 10*3/uL — ABNORMAL LOW (ref 0.7–4.0)
MCH: 35.3 pg — ABNORMAL HIGH (ref 26.0–34.0)
MCHC: 32.9 g/dL (ref 30.0–36.0)
MCV: 107.1 fL — ABNORMAL HIGH (ref 80.0–100.0)
Monocytes Absolute: 0.4 10*3/uL (ref 0.1–1.0)
Monocytes Relative: 9 %
Neutro Abs: 3.5 10*3/uL (ref 1.7–7.7)
Neutrophils Relative %: 84 %
Platelets: 22 10*3/uL — CL (ref 150–400)
RBC: 2.24 MIL/uL — ABNORMAL LOW (ref 3.87–5.11)
RDW: 17 % — ABNORMAL HIGH (ref 11.5–15.5)
WBC: 4.2 10*3/uL (ref 4.0–10.5)
nRBC: 0 % (ref 0.0–0.2)

## 2022-08-20 LAB — CBG MONITORING, ED
Glucose-Capillary: 128 mg/dL — ABNORMAL HIGH (ref 70–99)
Glucose-Capillary: 119 mg/dL — ABNORMAL HIGH (ref 70–99)
Glucose-Capillary: 131 mg/dL — ABNORMAL HIGH (ref 70–99)
Glucose-Capillary: 123 mg/dL — ABNORMAL HIGH (ref 70–99)

## 2022-08-20 LAB — COMPREHENSIVE METABOLIC PANEL
ALT: 28 U/L (ref 0–44)
AST: 65 U/L — ABNORMAL HIGH (ref 15–41)
Albumin: 2.2 g/dL — ABNORMAL LOW (ref 3.5–5.0)
Alkaline Phosphatase: 62 U/L (ref 38–126)
Anion gap: 10 (ref 5–15)
BUN: 60 mg/dL — ABNORMAL HIGH (ref 8–23)
CO2: 28 mmol/L (ref 22–32)
Calcium: 9.7 mg/dL (ref 8.9–10.3)
Chloride: 93 mmol/L — ABNORMAL LOW (ref 98–111)
Creatinine, Ser: 5.56 mg/dL — ABNORMAL HIGH (ref 0.44–1.00)
GFR, Estimated: 8 mL/min — ABNORMAL LOW (ref >60)
Glucose, Bld: 125 mg/dL — ABNORMAL HIGH (ref 70–99)
Potassium: 3.9 mmol/L (ref 3.5–5.1)
Sodium: 131 mmol/L — ABNORMAL LOW (ref 135–145)
Total Bilirubin: 1.9 mg/dL — ABNORMAL HIGH (ref 0.3–1.2)
Total Protein: 5.2 g/dL — ABNORMAL LOW (ref 6.5–8.1)

## 2022-08-20 LAB — GLUCOSE, CAPILLARY: Glucose-Capillary: 162 mg/dL — ABNORMAL HIGH (ref 70–99)

## 2022-08-20 LAB — PREALBUMIN: Prealbumin: <5 mg/dL — ABNORMAL LOW (ref 18–38)

## 2022-08-20 LAB — VITAMIN B12: Vitamin B-12: 2426 pg/mL — ABNORMAL HIGH (ref 180–914)

## 2022-08-20 LAB — OSMOLALITY: Osmolality: 301 mOsm/kg — ABNORMAL HIGH (ref 275–295)

## 2022-08-20 LAB — CBC
HCT: 23.9 % — ABNORMAL LOW (ref 36.0–46.0)
Hemoglobin: 7.7 g/dL — ABNORMAL LOW (ref 12.0–15.0)
MCH: 34.1 pg — ABNORMAL HIGH (ref 26.0–34.0)
MCHC: 32.2 g/dL (ref 30.0–36.0)
MCV: 105.8 fL — ABNORMAL HIGH (ref 80.0–100.0)
Platelets: 22 10*3/uL — CL (ref 150–400)
RBC: 2.26 MIL/uL — ABNORMAL LOW (ref 3.87–5.11)
RDW: 16.9 % — ABNORMAL HIGH (ref 11.5–15.5)
WBC: 4.2 10*3/uL (ref 4.0–10.5)
nRBC: 0 % (ref 0.0–0.2)

## 2022-08-20 LAB — TECHNOLOGIST SMEAR REVIEW: Plt Morphology: DECREASED

## 2022-08-20 LAB — PROTIME-INR
INR: 6.7 (ref 0.8–1.2)
Prothrombin Time: 57.9 seconds — ABNORMAL HIGH (ref 11.4–15.2)

## 2022-08-20 LAB — HEPATITIS C ANTIBODY: HCV Ab: NONREACTIVE

## 2022-08-20 LAB — BRAIN NATRIURETIC PEPTIDE: B Natriuretic Peptide: 705 pg/mL — ABNORMAL HIGH (ref 0.0–100.0)

## 2022-08-20 LAB — HEPATITIS PANEL, ACUTE
HCV Ab: NONREACTIVE
Hep A IgM: NONREACTIVE
Hep B C IgM: NONREACTIVE
Hepatitis B Surface Ag: NONREACTIVE

## 2022-08-20 LAB — HEPATITIS B SURFACE ANTIBODY,QUALITATIVE: Hep B S Ab: NONREACTIVE

## 2022-08-20 LAB — FOLATE: Folate: 31.6 ng/mL (ref >5.9)

## 2022-08-20 LAB — LACTIC ACID, PLASMA: Lactic Acid, Venous: 2 mmol/L (ref 0.5–1.9)

## 2022-08-20 LAB — HEPATITIS B SURFACE ANTIGEN: Hepatitis B Surface Ag: NONREACTIVE

## 2022-08-20 LAB — HIV ANTIBODY (ROUTINE TESTING W REFLEX): HIV Screen 4th Generation wRfx: NONREACTIVE

## 2022-08-20 LAB — C-REACTIVE PROTEIN: CRP: 18 mg/dL — ABNORMAL HIGH (ref <1.0)

## 2022-08-20 LAB — MAGNESIUM: Magnesium: 2.9 mg/dL — ABNORMAL HIGH (ref 1.7–2.4)

## 2022-08-20 LAB — HEPATITIS B CORE ANTIBODY, TOTAL: Hep B Core Total Ab: NONREACTIVE

## 2022-08-20 MED ORDER — SERTRALINE HCL 50 MG PO TABS
50.0000 mg | ORAL_TABLET | Freq: Every day | ORAL | Status: DC
  Administered 2022-08-20 – 2022-08-21 (×2): 50 mg via ORAL
  Filled 2022-08-20 (×3): qty 1

## 2022-08-20 MED ORDER — TRAMADOL HCL 50 MG PO TABS
50.0000 mg | ORAL_TABLET | Freq: Four times a day (QID) | ORAL | Status: DC | PRN
  Administered 2022-08-20 – 2022-08-22 (×2): 50 mg via ORAL
  Filled 2022-08-20 (×2): qty 1

## 2022-08-20 MED ORDER — LACTATED RINGERS IV SOLN: INTRAVENOUS | Status: DC

## 2022-08-20 MED ORDER — HYDROMORPHONE HCL 2 MG PO TABS
1.0000 mg | ORAL_TABLET | Freq: Four times a day (QID) | ORAL | Status: DC | PRN
  Administered 2022-08-20: 1 mg via ORAL
  Filled 2022-08-20 (×2): qty 1

## 2022-08-20 MED ORDER — VITAMIN K1 10 MG/ML IJ SOLN: 1.0000 mg | Freq: Once | INTRAMUSCULAR | Status: DC

## 2022-08-20 MED ORDER — SODIUM CHLORIDE 0.9 % IV BOLUS
250.0000 mL | Freq: Once | INTRAVENOUS | Status: AC
  Administered 2022-08-20: 250 mL via INTRAVENOUS

## 2022-08-20 MED ORDER — CHLORHEXIDINE GLUCONATE CLOTH 2 % EX PADS
6.0000 | MEDICATED_PAD | Freq: Every day | CUTANEOUS | Status: DC
  Administered 2022-08-20 – 2022-08-22 (×3): 6 via TOPICAL

## 2022-08-20 MED ORDER — VITAMIN D 25 MCG (1000 UNIT) PO TABS
1000.0000 [IU] | ORAL_TABLET | Freq: Every day | ORAL | Status: DC
  Administered 2022-08-20 – 2022-08-21 (×2): 1000 [IU] via ORAL
  Filled 2022-08-20 (×3): qty 1

## 2022-08-20 MED ORDER — DEXTROSE 5 % IV SOLN
1.0000 mg | Freq: Once | INTRAVENOUS | Status: AC
  Administered 2022-08-20: 1 mg via INTRAVENOUS
  Filled 2022-08-20: qty 0.1

## 2022-08-20 MED ORDER — PRAVASTATIN SODIUM 10 MG PO TABS
20.0000 mg | ORAL_TABLET | Freq: Every day | ORAL | Status: DC
  Administered 2022-08-20 – 2022-08-21 (×2): 20 mg via ORAL
  Filled 2022-08-20 (×2): qty 2

## 2022-08-20 MED ORDER — MIDODRINE HCL 5 MG PO TABS
10.0000 mg | ORAL_TABLET | Freq: Once | ORAL | Status: AC
  Administered 2022-08-20: 10 mg via ORAL

## 2022-08-20 MED ORDER — PANTOPRAZOLE SODIUM 40 MG PO TBEC
40.0000 mg | DELAYED_RELEASE_TABLET | Freq: Every day | ORAL | Status: DC
  Administered 2022-08-20 – 2022-08-21 (×2): 40 mg via ORAL
  Filled 2022-08-20 (×3): qty 1

## 2022-08-20 MED ORDER — ONDANSETRON HCL 4 MG/2ML IJ SOLN
4.0000 mg | Freq: Four times a day (QID) | INTRAMUSCULAR | Status: DC | PRN
  Administered 2022-08-20: 4 mg via INTRAVENOUS
  Filled 2022-08-20: qty 2

## 2022-08-20 NOTE — Progress Notes (Addendum)
PT Cancellation Note  Patient Details Name: Dorothy Cooper MRN: [id number] DOB: January 26, 1950   Cancelled Treatment:    Reason Eval/Treat Not Completed: Patient not medically ready.  Awaiting TLSO, will retry as time and pt allow.   Ramond Dial 08/20/2022, 10:23 AM  Mee Hives, PT PhD Acute Rehab Dept. Number: [place] [facility] [place]

## 2022-08-20 NOTE — Progress Notes (Addendum)
PROGRESS NOTE                                                                                                                                                                                                             Patient Demographics:    Dorothy Cooper, is a 72 y.o. female, DOB - [birth year], KCM:[id number]  Outpatient Primary MD for the patient is Pcp, No    LOS - 0  Admit date - 08/19/2022    Chief Complaint  Patient presents with   Altered Mental Status       Brief Narrative (HPI from H&P)   72 y.o. 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 In the ER she was found to have toxic and metabolic encephalopathy, acute on chronic back and abdominal pain, UTI, acute on chronic thrombocytopenia with elevated INR.   Subjective:    Dorothy Cooper today has, No headache, No chest pain, No abdominal pain - No Nausea, No new weakness tingling or numbness, no SOB.   Assessment  & Plan :   Acute metabolic and toxic encephalopathy.  Combination of UTI, acute on chronic pain and being on narcotics - CT head nonacute, she has no focal deficits, has acute on chronic back pain with age indeterminant T and L-spine fractures, UA suggestive of UTI.  Overall she has a nontoxic appearance, she has been placed on appropriate IV antibiotics, will minimize narcotics as much as possible, continue supportive care.  She is gradually improving.  Continue to monitor.  Follow cultures.  T and L-spine compression fractures.   Age-indeterminate.  CT reviewed with neurosurgeon Dr. Trenton Gammon, supportive care with TLSO brace when she is out of bed.  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  We will give her some  vitamin K IV, currently no signs of bleeding.  Monitor closely.  Referral smear has been sent however less likely that she has DIC as her LDH is normal, no schistocytes on peripheral smear.  She is been following with hematologist at [facility] will request our hematology team to evaluate her 1 time.  Outpatient bone marrow biopsy was being considered will defer that to hematology.  ESRD.  On TTS schedule.  Nephrology has been called.  Paroxysmal atrial fibrillation.  Mali vas 2 score of greater than 3.  Currently blood pressure is too low, she is on low-dose Coreg, Coreg will be held, she was supposed to be off of Eliquis for her granddaughter, Eliquis will be discontinued upon discharge.  Hypotension.  Likely due to combination of UTI and volume depletion, hold Coreg, midodrine, gentle IV fluids x1 for a total of 500 cc as she is ESRD.  COPD/asthma.  At baseline no wheezing.  Supportive care.  Dyslipidemia.  Home med rec still not done.  Will reevaluate after med rec.  DM type II.  Check A1c, low-dose sliding scale.  No results found for: "HGBA1C"  CBG (last 3)  Recent Labs    08/19/22 2344 08/20/22 0436 08/20/22 0814  GLUCAP 161* 128* 119*         Condition - Extremely Guarded  Family Communication  :    Granddaughter [name] [phone]  08/20/22  Code Status :  Full  Consults  :  Renal, Haem  PUD Prophylaxis :     Procedures  :     CT Head - Non acute  CT Chest - 1. Hepatic cirrhosis. Splenomegaly and left upper quadrant  collaterals and splenorenal shunting. Minimal perihepatic ascites. 2. Cholelithiasis without definite pericholecystic inflammation. 3. Dependent atelectasis in both lungs.  No pneumonia. 4. Cardiomegaly.  Coronary artery calcifications. 5. Multiple thoracic and lumbar compression deformities. Marked compression deformity of L2 and L3 with buckling of the posterior cortex. Moderate L5 compression fracture. There is a fracture through the superior aspect of L1 vertebral body that is age indeterminate. Mild T6 compression fracture.      Disposition Plan  :    Status is: Observation  DVT Prophylaxis  :    SCDs Start: 08/19/22 2237   Lab Results  Component Value Date   PLT 22 (LL) 08/20/2022   PLT 22 (LL) 08/20/2022    Diet :  Diet Order             Diet NPO time specified Except for: Ice Chips  Diet effective now                    Inpatient Medications  Scheduled Meds:  insulin aspart  0-6 Units Subcutaneous Q4H   midodrine  10 mg Oral TID WC   Continuous Infusions:  cefTRIAXone (ROCEPHIN)  IV     methocarbamol (ROBAXIN) IV     PRN Meds:.acetaminophen **OR** acetaminophen, methocarbamol (ROBAXIN) IV, ondansetron (ZOFRAN) IV  Time Spent in minutes  30   Lala Lund M.D on 08/20/2022 at 8:15 AM  To page go to [email or web address]   [facility] -  Office  [phone]  See all Orders from today for further details    Objective:   Vitals:   08/20/22 0530 08/20/22 0645 08/20/22 0657 08/20/22 0715  BP: (!) 98/48 (!) 102/48  (!) 100/48  Pulse: 74 71  69  Resp: _0 Temp:   98.7 F (37.1 C)   TempSrc:   Oral  SpO2: 94% 92%  95%    Wt Readings from Last 3 Encounters:  05/01/22 72.6 kg     Intake/Output Summary (Last 24 hours) at 08/20/2022 0815 Last data filed at 08/20/2022 0647 Gross per 24 hour  Intake 603.99 ml  Output --  Net 603.99 ml     Physical Exam  Awake but confused, No new F.N deficits,   [place].AT,PERRAL Supple Neck, No JVD,    Symmetrical Chest wall movement, Good air movement bilaterally, CTAB RRR,No Gallops,Rubs or new Murmurs,  +ve B.Sounds, Abd Soft, No tenderness,   No Cyanosis, Clubbing or edema       Data Review:    CBC Recent Labs  Lab 08/19/22 1515 08/19/22 1855 08/20/22 0500  WBC 4.7  --  4.2  4.2  HGB 9.1*  --  7.9*  7.7*  HCT 27.4*  --  24.0*  23.9*  PLT 26* 21* 22*  22*  MCV 106.6*  --  107.1*  105.8*  MCH 35.4*  --  35.3*  34.1*  MCHC 33.2  --  32.9  32.2  RDW 17.0*  --  17.0*  16.9*  LYMPHSABS 0.3*  --  0.2*  MONOABS 0.3  --  0.4  EOSABS 0.0  --  0.0  BASOSABS 0.0  --  0.0    Electrolytes Recent Labs  Lab 08/19/22 1515 08/19/22 1812 08/19/22 1855 08/19/22 2358 08/20/22 0500  NA 132*  --   --   --  131*  K 4.1  --   --   --  3.9  CL 92*  --   --   --  93*  CO2 28  --   --   --  28  GLUCOSE 179*  --   --   --  125*  BUN 52*  --   --   --  60*  CREATININE 5.35*  --   --   --  5.56*  CALCIUM 10.1  --   --   --  9.7  AST 60*  --   --   --  65*  ALT 33  --   --   --  28  ALKPHOS 75  --   --   --  62  BILITOT 2.2*  --   --   --  1.9*  ALBUMIN 2.1*  --   --   --  2.2*  MG 2.9*  --   --   --  2.9*  CRP  --   --   --   --  18.0*  DDIMER  --   --  3.19*  --   --   LATICACIDVEN 2.1* 2.6*  --  2.0*  --   INR  --   --  7.4*  --  6.7*  AMMONIA  --  27  --   --   --    ID Labs Recent Labs  Lab 08/19/22 1515 08/19/22 1812 08/19/22 1855 08/19/22 2358 08/20/22 0500  WBC 4.7  --   --   --  4.2  4.2  PLT 26*  --  21*  --  22*  22*  CRP  --   --   --   --  18.0*  DDIMER  --   --  3.19*  --   --   LATICACIDVEN 2.1* 2.6*  --  2.0*  --   CREATININE 5.35*  --   --   --  5.56*    Micro Results No results found  for this or any previous visit (from the past 240 hour(s)).  Radiology Reports DG Chest Port 1 View  Result Date: 08/20/2022 CLINICAL DATA:  72 year old female with history of altered mental status. EXAM: PORTABLE CHEST 1 VIEW COMPARISON:  Chest x-ray  08/19/2022. FINDINGS: Lung volumes are low. No consolidative airspace disease. There is cephalization of the pulmonary vasculature and slight indistinctness of the interstitial markings suggestive of mild pulmonary edema. Trace right pleural effusion. No definite left pleural effusion. Mild cardiomegaly. The patient is rotated to the right on today's exam, resulting in distortion of the mediastinal contours and reduced diagnostic sensitivity and specificity for mediastinal pathology. Atherosclerotic calcifications in the thoracic aorta. IMPRESSION: 1. The appearance the chest suggests mild congestive heart failure, as above. 2. Aortic atherosclerosis. Electronically Signed   By: Vinnie Langton M.D.   On: 08/20/2022 06:54   CT CHEST ABDOMEN PELVIS W CONTRAST  Result Date: 08/19/2022 CLINICAL DATA:  Sepsis. EXAM: CT CHEST, ABDOMEN, AND PELVIS WITH CONTRAST TECHNIQUE: Multidetector CT imaging of the chest, abdomen and pelvis was performed following the standard protocol during bolus administration of intravenous contrast. RADIATION DOSE REDUCTION: This exam was performed according to the departmental dose-optimization program which includes automated exposure control, adjustment of the mA and/or kV according to patient size and/or use of iterative reconstruction technique. CONTRAST:  84m OMNIPAQUE IOHEXOL 300 MG/ML  SOLN COMPARISON:  Chest radiograph earlier today. FINDINGS: CT CHEST FINDINGS Cardiovascular: Aortic atherosclerosis and tortuosity without aneurysm or acute aortic findings. No obvious central pulmonary embolus on this exam not tailored for pulmonary arteries S mint. Borderline cardiomegaly. There are coronary artery calcifications. No pericardial effusion. Mediastinum/Nodes: No mediastinal adenopathy or mass. No hilar adenopathy. Decompressed esophagus. No visible thyroid nodule. Lungs/Pleura: Motion artifact through the bases. Dependent atelectasis in the right greater than left lower lobe. No  confluent consolidation. Mild central bronchial thickening without endobronchial lesion. No significant pleural effusion. Musculoskeletal: Remote right proximal humerus fracture with posttraumatic deformity. Exaggerated thoracic kyphosis. The bones are diffusely under mineralized. Mild T6 compression deformity. No posterior cortex involvement. CT ABDOMEN PELVIS FINDINGS Hepatobiliary: Nodular hepatic contours consistent with cirrhosis. There is no discrete focal hepatic lesion. Motion artifact through the liver limits assessment. Innumerable calcified gallstones. No definite pericholecystic inflammation. No biliary dilatation, although the common bile duct is poorly defined. Pancreas: No ductal dilatation or inflammation. Spleen: Splenomegaly with spleen measuring 15.1 x 6 x 13.4 cm (volume = 600 cm^3). Small subcapsular low-density posteriorly series 3, image 49, nonspecific. Adrenals/Urinary Tract: No adrenal nodule. Bilateral renal parenchymal atrophy. No hydronephrosis or focal renal lesion. Urinary bladder is obscured by streak artifact from left hip arthroplasty. 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 The stomach is decompressed. There is no small bowel obstruction or inflammation. Small to moderate volume of colonic stool without colonic inflammation. The appendix is not visualized. Vascular/Lymphatic: Moderate to advanced aortic and branch atherosclerosis. No aortic aneurysm. There is no evidence of portal vein thrombosis, although phase of contrast limits portal vein assessment. Left upper quadrant collaterals with splenorenal shunting. Reproductive: Uterus primarily obscured by streak artifact from left hip arthroplasty. There is no obvious adnexal mass. Other: Minimal perihepatic ascites. Mild generalized edema of the subcutaneous and intra-abdominal fat. No free air. Surgical staples in the anterior abdominal wall. Musculoskeletal: Bones diffusely under mineralized. Marked compression deformity  of L2 and L3 with buckling of the posterior cortex. Moderate L5 compression fracture. There is a fracture through the superior aspect of L1 vertebral body that is age indeterminate. Left hip arthroplasty. IMPRESSION:  1. Hepatic cirrhosis. Splenomegaly and left upper quadrant collaterals and splenorenal shunting. Minimal perihepatic ascites. 2. Cholelithiasis without definite pericholecystic inflammation. 3. Dependent atelectasis in both lungs.  No pneumonia. 4. Cardiomegaly.  Coronary artery calcifications. 5. Multiple thoracic and lumbar compression deformities. Marked compression deformity of L2 and L3 with buckling of the posterior cortex. Moderate L5 compression fracture. There is a fracture through the superior aspect of L1 vertebral body that is age indeterminate. Mild T6 compression fracture. Recommend correlation with focal tenderness. Aortic Atherosclerosis (ICD10-I70.0). Electronically Signed   By: Keith Rake M.D.   On: 08/19/2022 18:56   CT Head Wo Contrast  Result Date: 08/19/2022 CLINICAL DATA:  Mental status change, unknown cause. EXAM: CT HEAD WITHOUT CONTRAST TECHNIQUE: Contiguous axial images were obtained from the base of the skull through the vertex without intravenous contrast. RADIATION DOSE REDUCTION: This exam was performed according to the departmental dose-optimization program which includes automated exposure control, adjustment of the mA and/or kV according to patient size and/or use of iterative reconstruction technique. COMPARISON:  None Available. FINDINGS: Brain: No evidence of acute infarction, hemorrhage, hydrocephalus, extra-axial collection or mass lesion/mass effect. Prominence of the ventricles and sulci secondary to mild cerebral volume loss. Patchy areas of low-attenuation presumed chronic microvascular ischemic changes. Vascular: No hyperdense vessel or unexpected calcification. Skull: Normal. Negative for fracture or focal lesion. Sinuses/Orbits: No acute finding.  Other: None. IMPRESSION: 1.  No acute intracranial abnormality. 2. Mild cerebral volume loss and chronic microvascular ischemic changes of the white matter. Electronically Signed   By: Keane Police D.O.   On: 08/19/2022 16:48   DG Chest Portable 1 View  Result Date: 08/19/2022 CLINICAL DATA:  Altered mental status EXAM: PORTABLE CHEST 1 VIEW COMPARISON:  Chest radiograph 05/01/2022 FINDINGS: The heart is enlarged, unchanged. The upper mediastinal contours are stable. There is no focal consolidation or pulmonary edema. There is no pleural effusion or pneumothorax There is no acute osseous abnormality. IMPRESSION: Unchanged cardiomegaly.  No focal consolidation or pleural effusion. Electronically Signed   By: Valetta Mole M.D.   On: 08/19/2022 16:25

## 2022-08-20 NOTE — ED Notes (Signed)
Pt transferred to hospital bed

## 2022-08-20 NOTE — Progress Notes (Signed)
Brief Palliative Medicine Progress Note:  PMT consult received and chart reviewed. GOC completed with patient's two daughters and granddaughter - full note to follow:  SUMMARY OF RECOMMENDATIONS   Continue current medical treatment with watchful waiting Now DNR/DNI Family are hopeful patient may improve enough to make her own medical decisions; if no significant improvement by Wednesday 8/23 they will be ready for her transition to full comfort/hospice Ongoing GOC pending clinical course PMT will continue to follow and support holistically  Thank you for allowing PMT to assist in the care of this patient.  Brailee Riede M. Tamala Julian [facility] Palliative Medicine Team Team Phone: [phone] NO CHARGE

## 2022-08-20 NOTE — Consult Note (Addendum)
WOC Nurse Consult Note: Patient receiving care in [facility] ED 26. Primary RN assisted with turning so that I could evaluate her buttocks. Reason for Consult: "pressure ulcer" Wound type: A small DTPI that measures 1 cm x 1 cm found on the upper left buttock. There is a small foam dressing over the area.  There are other dark pink areas on the buttocks, but at this time they blanche. The left medial heel doesn't have an open wound. There is a peeling area that is normal in color that measures 4 cm x 2 cm. For the heels I have ordered the use of Prevalon heel lift boots to off load pressure to the heels.  On the left great toe joint there is a darkened spot that is not an open wound, and the toe seems slightly edematous and erythematous.  For this I have ordered twice daily application of iodine. Pressure Injury POA: Yes Measurement: Wound bed: Drainage (amount, consistency, odor) no drainage form any of the described areas; nor any odor detected. Periwound: intact Dressing procedure/placement/frequency: As above.   I have also entered an order for turning the patient.  Monitor the wound area(s) for worsening of condition such as: Signs/symptoms of infection,  Increase in size,  Development of or worsening of odor, Development of pain, or increased pain at the affected locations.  Notify the medical team if any of these develop.  Thank you for the consult.  [place] nurse will not follow at this time.  Please re-consult the [place] team if needed.  Val Riles, RN, MSN, CWOCN, CNS-BC, pager [phone]

## 2022-08-20 NOTE — Consult Note (Cosign Needed)
[place]  [phone]) [phone] Fax:([phone]    [place]  Referring MD:  Dr. Lala Lund  Reason for Referral: Anemia, thrombocytopenia  HPI: Dorothy Cooper is a 72 year old female with a past medical history significant for end-stage renal disease, thrombocytopenia, cirrhosis due to unclear etiology, diastolic CHF, chronic anemia, asthma, COPD, diabetes mellitus, glaucoma, hyperlipidemia, hypertension, A-fib, migraine headaches.  The patient resides at a skilled nursing facility.  He presented to the emergency department with confusion.  Work-up in the emergency department concerning for UTI.  She has been started on IV antibiotics.  CBC on admission showed a WBC of 4.2, hemoglobin 7.7, MCV 105.8, platelets 22,000, BUN 60, creatinine 5.56, AST 65, T. bili 1.9.  DIC panel showed a normal fibrinogen with elevated INR and PTT (on Eliquis).  No schistocytes were seen.  LDH level was normal at 181, reticulocyte count percentage 5.7%, absolute reticulocyte count 160.7, and immature reticulocyte fraction 30.1%.  CT head did not show any acute intracranial abnormality.  CT chest/abdomen/pelvis showed hepatic cirrhosis, splenomegaly, and left upper quadrant collaterals and splenorenal shunting.  Her most recent CBC available to me prior to this admission was performed on 05/01/2022.  At that time, her WBC was 3.5, hemoglobin 10.2, MCV 104.6, platelets 39,000.  Outside records reviewed through care everywhere.  The patient was seen by hematology/oncology on 02/23/2022.  It was thought that she could have an underlying bone marrow failure like MDS but she has been refusing a bone marrow biopsy.  Since counts are stable, continued observation was recommended.  The patient was seen in the emergency department.  No family at the bedside.  The patient is able to verbalize to me that she "hurts."  However, she did not fully answer any my questions.  History was  obtained from chart review.  Hematology was asked to the patient make recommendations regarding her anemia and thrombocytopenia.  Past Medical History:  Diagnosis Date   Anemia    CHF (congestive heart failure) (HCC)    Chronic kidney disease    Diabetes mellitus without complication ([place])    Dysrhythmia    Hypertension   :  History reviewed. No pertinent surgical history.:   CURRENT MEDS: Current Facility-Administered Medications  Medication Dose Route Frequency Provider Last Rate Last Admin   acetaminophen (TYLENOL) tablet 650 mg  650 mg Oral Q6H PRN Doutova, Anastassia, MD       Or   acetaminophen (TYLENOL) suppository 650 mg  650 mg Rectal Q6H PRN Doutova, Anastassia, MD       cefTRIAXone (ROCEPHIN) 1 g in sodium chloride 0.9 % 100 mL IVPB  1 g Intravenous Q24H Doutova, Anastassia, MD       insulin aspart (novoLOG) injection 0-6 Units  0-6 Units Subcutaneous Q4H Doutova, Anastassia, MD   1 Units at 08/19/22 2359   lactated ringers infusion   Intravenous Continuous Thurnell Lose, MD 100 mL/hr at 08/20/22 0909 New Bag at 08/20/22 0909   methocarbamol (ROBAXIN) 500 mg in dextrose 5 % 50 mL IVPB  500 mg Intravenous Q6H PRN Doutova, Anastassia, MD       midodrine (PROAMATINE) tablet 10 mg  10 mg Oral TID WC Doutova, Anastassia, MD       ondansetron (ZOFRAN) injection 4 mg  4 mg Intravenous Q6H PRN Kristopher Oppenheim, DO   4 mg at 08/20/22 0230   pantoprazole (PROTONIX) EC tablet 40 mg  40 mg Oral Daily Thurnell Lose, MD  phytonadione (VITAMIN K) 1 mg in dextrose 5 % 50 mL IVPB  1 mg Intravenous Once Thurnell Lose, MD 50 mL/hr at 08/20/22 0910 1 mg at 08/20/22 0910   traMADol (ULTRAM) tablet 50 mg  50 mg Oral Q6H PRN Thurnell Lose, MD       No current outpatient medications on file.       Allergies  Allergen Reactions   Penicillins Anaphylaxis   Amlodipine    Aspirin     Throat swells   Gabapentin   :   Family History  Problem Relation Age of Onset   CAD  Other    Hypertension Other   :   Social History   Socioeconomic History   Marital status: Unknown    Spouse name: Not on file   Number of children: Not on file   Years of education: Not on file   Highest education level: Not on file  Occupational History   Not on file  Tobacco Use   Smoking status: Former    Types: Cigarettes   Smokeless tobacco: Never  Substance and Sexual Activity   Alcohol use: Not Currently   Drug use: Never   Sexual activity: Not on file  Other Topics Concern   Not on file  Social History Narrative   Not on file   Social Determinants of Health   Financial Resource Strain: Not on file  Food Insecurity: Not on file  Transportation Needs: Not on file  Physical Activity: Not on file  Stress: Not on file  Social Connections: Not on file  Intimate Partner Violence: Not on file  :  REVIEW OF SYSTEMS: Unable to obtain.  Exam: Patient Vitals for the past 24 hrs:  BP Temp Temp src Pulse Resp SpO2  08/20/22 0845 (!) 102/54 -- -- 70 17 96 %  08/20/22 0715 (!) 100/48 -- -- 69 18 95 %  08/20/22 0657 -- 98.7 F (37.1 C) Oral -- -- --  08/20/22 0645 (!) 102/48 -- -- 71 19 92 %  08/20/22 0530 (!) 98/48 -- -- 74 14 94 %  08/20/22 0515 (!) 97/45 -- -- 73 -- 92 %  08/20/22 0500 (!) 93/44 -- -- 71 -- 94 %  08/20/22 0415 (!) 95/43 -- -- 74 -- 93 %  08/20/22 0345 (!) 97/52 -- -- 72 -- 91 %  08/20/22 0230 (!) 107/49 -- -- 78 20 96 %  08/20/22 0215 (!) 99/52 -- -- 74 -- 92 %  08/20/22 0200 (!) 100/49 -- -- 77 -- 91 %  08/20/22 0145 (!) 93/52 -- -- 74 (!) 22 98 %  08/20/22 0130 (!) 99/48 -- -- 76 -- 92 %  08/20/22 0123 -- 97.7 F (36.5 C) Oral -- -- --  08/20/22 0115 (!) 96/55 -- -- 80 -- 96 %  08/20/22 0100 (!) 95/48 -- -- 76 (!) 21 95 %  08/20/22 0000 (!) 103/44 -- -- 76 (!) 22 96 %  08/19/22 2345 (!) 102/44 -- -- 77 18 96 %  08/19/22 2315 (!) 102/51 -- -- 76 (!) 0 95 %  08/19/22 2245 100/64 -- -- 74 20 96 %  08/19/22 2115 (!) 104/49 -- -- 72 18 96 %   08/19/22 2100 (!) 96/46 -- -- 72 20 97 %  08/19/22 2050 -- 97.8 F (36.6 C) Oral -- -- --  08/19/22 2030 (!) 102/44 -- -- 73 18 94 %  08/19/22 2015 (!) 98/49 -- -- 74 17 96 %  08/19/22 2000 Dorothy Cooper Kitchen)  109/46 -- -- 77 16 95 %  08/19/22 1945 (!) 97/48 -- -- 78 17 100 %  08/19/22 1930 (!) 108/46 -- -- 77 18 96 %  08/19/22 1915 (!) 103/50 -- -- 89 11 (!) 80 %  08/19/22 1900 (!) 101/50 -- -- 74 18 95 %  08/19/22 1800 (!) 106/44 -- -- 77 16 95 %  08/19/22 1745 (!) 108/44 -- -- 76 12 97 %  08/19/22 1730 (!) 106/44 -- -- 72 (!) 23 98 %  08/19/22 1715 (!) 99/44 -- -- 69 18 98 %  08/19/22 1700 (!) 100/51 -- -- 71 16 96 %  08/19/22 1645 (!) 96/45 -- -- 71 (!) 21 97 %  08/19/22 1615 (!) 116/50 -- -- 70 (!) 21 98 %  08/19/22 1600 (!) 115/57 -- -- 68 14 97 %  08/19/22 1545 (!) 102/53 -- -- 65 (!) 21 100 %  08/19/22 1500 (!) 88/43 99.5 F (37.5 C) Oral 68 16 93 %    Physical Exam Vitals reviewed.  Constitutional:      Appearance: She is ill-appearing.  Eyes:     General: No scleral icterus.    Conjunctiva/sclera: Conjunctivae normal.  Pulmonary:     Effort: Pulmonary effort is normal. No respiratory distress.  Abdominal:     Palpations: Abdomen is soft.  Skin:    General: Skin is warm and dry.  Neurological:     Mental Status: She is alert. She is disoriented.    LABS:  Lab Results  Component Value Date   WBC 4.2 08/20/2022   WBC 4.2 08/20/2022   HGB 7.7 (L) 08/20/2022   HGB 7.9 (L) 08/20/2022   HCT 23.9 (L) 08/20/2022   HCT 24.0 (L) 08/20/2022   PLT 22 (LL) 08/20/2022   PLT 22 (LL) 08/20/2022   GLUCOSE 125 (H) 08/20/2022   ALT 28 08/20/2022   AST 65 (H) 08/20/2022   NA 131 (L) 08/20/2022   K 3.9 08/20/2022   CL 93 (L) 08/20/2022   CREATININE 5.56 (H) 08/20/2022   BUN 60 (H) 08/20/2022   CO2 28 08/20/2022   INR 6.7 (HH) 08/20/2022    DG Chest Port 1 View  Result Date: 08/20/2022 CLINICAL DATA:  72 year old female with history of altered mental status. EXAM: PORTABLE  CHEST 1 VIEW COMPARISON:  Chest x-ray 08/19/2022. FINDINGS: Lung volumes are low. No consolidative airspace disease. There is cephalization of the pulmonary vasculature and slight indistinctness of the interstitial markings suggestive of mild pulmonary edema. Trace right pleural effusion. No definite left pleural effusion. Mild cardiomegaly. The patient is rotated to the right on today's exam, resulting in distortion of the mediastinal contours and reduced diagnostic sensitivity and specificity for mediastinal pathology. Atherosclerotic calcifications in the thoracic aorta. IMPRESSION: 1. The appearance the chest suggests mild congestive heart failure, as above. 2. Aortic atherosclerosis. Electronically Signed   By: Vinnie Langton M.D.   On: 08/20/2022 06:54   CT CHEST ABDOMEN PELVIS W CONTRAST  Result Date: 08/19/2022 CLINICAL DATA:  Sepsis. EXAM: CT CHEST, ABDOMEN, AND PELVIS WITH CONTRAST TECHNIQUE: Multidetector CT imaging of the chest, abdomen and pelvis was performed following the standard protocol during bolus administration of intravenous contrast. RADIATION DOSE REDUCTION: This exam was performed according to the departmental dose-optimization program which includes automated exposure control, adjustment of the mA and/or kV according to patient size and/or use of iterative reconstruction technique. CONTRAST:  41m OMNIPAQUE IOHEXOL 300 MG/ML  SOLN COMPARISON:  Chest radiograph earlier today. FINDINGS: CT CHEST FINDINGS  Cardiovascular: Aortic atherosclerosis and tortuosity without aneurysm or acute aortic findings. No obvious central pulmonary embolus on this exam not tailored for pulmonary arteries S mint. Borderline cardiomegaly. There are coronary artery calcifications. No pericardial effusion. Mediastinum/Nodes: No mediastinal adenopathy or mass. No hilar adenopathy. Decompressed esophagus. No visible thyroid nodule. Lungs/Pleura: Motion artifact through the bases. Dependent atelectasis in the  right greater than left lower lobe. No confluent consolidation. Mild central bronchial thickening without endobronchial lesion. No significant pleural effusion. Musculoskeletal: Remote right proximal humerus fracture with posttraumatic deformity. Exaggerated thoracic kyphosis. The bones are diffusely under mineralized. Mild T6 compression deformity. No posterior cortex involvement. CT ABDOMEN PELVIS FINDINGS Hepatobiliary: Nodular hepatic contours consistent with cirrhosis. There is no discrete focal hepatic lesion. Motion artifact through the liver limits assessment. Innumerable calcified gallstones. No definite pericholecystic inflammation. No biliary dilatation, although the common bile duct is poorly defined. Pancreas: No ductal dilatation or inflammation. Spleen: Splenomegaly with spleen measuring 15.1 x 6 x 13.4 cm (volume = 600 cm^3). Small subcapsular low-density posteriorly series 3, image 49, nonspecific. Adrenals/Urinary Tract: No adrenal nodule. Bilateral renal parenchymal atrophy. No hydronephrosis or focal renal lesion. Urinary bladder is obscured by streak artifact from left hip arthroplasty. 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 The stomach is decompressed. There is no small bowel obstruction or inflammation. Small to moderate volume of colonic stool without colonic inflammation. The appendix is not visualized. Vascular/Lymphatic: Moderate to advanced aortic and branch atherosclerosis. No aortic aneurysm. There is no evidence of portal vein thrombosis, although phase of contrast limits portal vein assessment. Left upper quadrant collaterals with splenorenal shunting. Reproductive: Uterus primarily obscured by streak artifact from left hip arthroplasty. There is no obvious adnexal mass. Other: Minimal perihepatic ascites. Mild generalized edema of the subcutaneous and intra-abdominal fat. No free air. Surgical staples in the anterior abdominal wall. Musculoskeletal: Bones diffusely under  mineralized. Marked compression deformity of L2 and L3 with buckling of the posterior cortex. Moderate L5 compression fracture. There is a fracture through the superior aspect of L1 vertebral body that is age indeterminate. Left hip arthroplasty. IMPRESSION: 1. Hepatic cirrhosis. Splenomegaly and left upper quadrant collaterals and splenorenal shunting. Minimal perihepatic ascites. 2. Cholelithiasis without definite pericholecystic inflammation. 3. Dependent atelectasis in both lungs.  No pneumonia. 4. Cardiomegaly.  Coronary artery calcifications. 5. Multiple thoracic and lumbar compression deformities. Marked compression deformity of L2 and L3 with buckling of the posterior cortex. Moderate L5 compression fracture. There is a fracture through the superior aspect of L1 vertebral body that is age indeterminate. Mild T6 compression fracture. Recommend correlation with focal tenderness. Aortic Atherosclerosis (ICD10-I70.0). Electronically Signed   By: Keith Rake M.D.   On: 08/19/2022 18:56   CT Head Wo Contrast  Result Date: 08/19/2022 CLINICAL DATA:  Mental status change, unknown cause. EXAM: CT HEAD WITHOUT CONTRAST TECHNIQUE: Contiguous axial images were obtained from the base of the skull through the vertex without intravenous contrast. RADIATION DOSE REDUCTION: This exam was performed according to the departmental dose-optimization program which includes automated exposure control, adjustment of the mA and/or kV according to patient size and/or use of iterative reconstruction technique. COMPARISON:  None Available. FINDINGS: Brain: No evidence of acute infarction, hemorrhage, hydrocephalus, extra-axial collection or mass lesion/mass effect. Prominence of the ventricles and sulci secondary to mild cerebral volume loss. Patchy areas of low-attenuation presumed chronic microvascular ischemic changes. Vascular: No hyperdense vessel or unexpected calcification. Skull: Normal. Negative for fracture or focal  lesion. Sinuses/Orbits: No acute finding. Other: None. IMPRESSION: 1.  No acute intracranial  abnormality. 2. Mild cerebral volume loss and chronic microvascular ischemic changes of the white matter. Electronically Signed   By: Keane Police D.O.   On: 08/19/2022 16:48   DG Chest Portable 1 View  Result Date: 08/19/2022 CLINICAL DATA:  Altered mental status EXAM: PORTABLE CHEST 1 VIEW COMPARISON:  Chest radiograph 05/01/2022 FINDINGS: The heart is enlarged, unchanged. The upper mediastinal contours are stable. There is no focal consolidation or pulmonary edema. There is no pleural effusion or pneumothorax There is no acute osseous abnormality. IMPRESSION: Unchanged cardiomegaly.  No focal consolidation or pleural effusion. Electronically Signed   By: Valetta Mole M.D.   On: 08/19/2022 16:25     ASSESSMENT AND PLAN:   1) thrombocytopenia -Baseline platelet count in the 30-50,000 range over the past year -DIC panel did not show any evidence of schistocytes -Labs from 08/19/2022-LDH 181, reticulocyte count percentage 5.7%, absolute reticulocyte count 160.7, immature reticulocyte fraction 30.1%, haptoglobin pending  2) macrocytic anemia -Anemia likely multifactorial and due to end-stage renal disease, liver disease, and cannot rule out an underlying hematologic condition such as MDS -She has no evidence of iron, folate, or vitamin B12 deficiency.  3) cirrhosis  4) splenomegaly  5) end-stage renal disease  6) probable urinary tract infection  7) acute metabolic encephalopathy  8) atrial fibrillation  PLAN: -Labs from this admission, prior labs through care everywhere, and hematology notes have been reviewed.  The patient has a longstanding history of pancytopenia.  She has known splenomegaly and underlying liver disease. -She does not have any evidence of DIC, TTP, or hemolysis at this time.  Could have a component of ITP.  Could also have underlying bone marrow failure such as MDS but she  has refused bone marrow biopsy and further work-up in the past.  She would not be able to consent for a bone marrow biopsy at this time given her encephalopathy. -Recommend supportive care for now including platelet transfusion for platelet count less than 10,000 or active bleeding. -Recommend PRBC transfusion for hemoglobin less than 7.  She is already receiving ESA per nephrology. -Continue treatment of UTI per primary team. -Agree with holding Eliquis in the setting of thrombocytopenia.  Thank you for this referral.  Mikey Bussing, DNP, AGPCNP-BC, AOCNP   ADDENDUM  .Patient was Personally and independently interviewed, examined and relevant elements of the history of present illness were reviewed in details and an assessment and plan was created. All elements of the patient's history of present illness , assessment and plan were discussed in details with Mikey Bussing, DNP, AGPCNP-BC, AOCNP . The above documentation reflects our combined findings assessment and plan.   Sullivan Lone MD MS

## 2022-08-20 NOTE — ED Notes (Signed)
ED TO INPATIENT HANDOFF REPORT  ED Nurse Name and Phone #: Kelby Aline [place] Name/Age/Gender Dorothy Cooper 72 y.o. female Room/Bed: 026C/026C  Code Status   Code Status: DNR  Home/SNF/Other Skilled nursing facility [facility]) Patient oriented to: self Is this baseline? No   Triage Complete: Triage complete  Chief Complaint Acute metabolic encephalopathy [E26.83]  Triage Note Pt arrives via [facility] from [facility] for AMS. Family saw pt on Thursday, she was alert and talking. Today they went to visit and she was more tired than normal, not speaking much and is slow to follow commands. Pt is warm to touch. 96/40, 93% RA, 72HR.   Allergies Allergies  Allergen Reactions   Penicillins Anaphylaxis   Amlodipine    Aspirin     Throat swells   Gabapentin     Level of Care/Admitting Diagnosis ED Disposition     ED Disposition  Admit   Condition  --   [place]: [place] [100100]  Level of Care: Progressive [102]  Admit to Progressive based on following criteria: ACUTE MENTAL DISORDER-RELATED Drug/Alcohol Ingestion/Overdose/Withdrawal, Suicidal Ideation/attempt requiring safety sitter and < Q2h monitoring/assessments, moderate to severe agitation that is managed with medication/sitter, CIWA-Ar score < 20.  May admit patient to [facility] or [facility] if equivalent level of care is available:: No  Covid Evaluation: Asymptomatic - no recent exposure (last 10 days) testing not required  Diagnosis: Acute metabolic encephalopathy [4196222]  Admitting Physician: Toy Baker [[id number]]  Attending Physician: Thurnell Lose [[id number]]  Certification:: I certify this patient will need inpatient services for at least 2 midnights  Estimated Length of Stay: 2          B Medical/Surgery History Past Medical History:  Diagnosis Date   Anemia    CHF (congestive heart failure) ([place])    Chronic kidney disease    Diabetes mellitus without  complication ([place])    Dysrhythmia    Hypertension    History reviewed. No pertinent surgical history.   A IV Location/Drains/Wounds Patient Lines/Drains/Airways Status     Active Line/Drains/Airways     Name Placement date Placement time Site Days   Peripheral IV 05/01/22 20 G Anterior;Right Forearm 05/01/22  1338  Forearm  111   Peripheral IV 08/19/22 20 G Anterior;Right Forearm 08/19/22  1716  Forearm  1            Intake/Output Last 24 hours  Intake/Output Summary (Last 24 hours) at 08/20/2022 1906 Last data filed at 08/20/2022 1641 Gross per 24 hour  Intake 1400.42 ml  Output --  Net 1400.42 ml    Labs/Imaging Results for orders placed or performed during the hospital encounter of 08/19/22 (from the past 48 hour(s))  Urinalysis, Routine w reflex microscopic     Status: Abnormal   Collection Time: 08/19/22  3:05 PM  Result Value Ref Range   Color, Urine YELLOW YELLOW   APPearance CLEAR CLEAR   Specific Gravity, Urine 1.020 1.005 - 1.030   pH 6.5 5.0 - 8.0   Glucose, UA NEGATIVE NEGATIVE mg/dL   Hgb urine dipstick LARGE (A) NEGATIVE   Bilirubin Urine MODERATE (A) NEGATIVE   Ketones, ur 15 (A) NEGATIVE mg/dL   Protein, ur 100 (A) NEGATIVE mg/dL   Nitrite POSITIVE (A) NEGATIVE   Leukocytes,Ua LARGE (A) NEGATIVE    Comment: Performed at [facility] [address] [place], [place] [address]  Urinalysis, Microscopic (reflex)     Status: None  Collection Time: 08/19/22  3:05 PM  Result Value Ref Range   RBC / HPF 21-50 0 - 5 RBC/hpf   WBC, UA >50 0 - 5 WBC/hpf   Bacteria, UA NONE SEEN NONE SEEN   Squamous Epithelial / LPF >50 0 - 5   WBC Clumps PRESENT     Comment: Performed at [facility], [place] [address] [place], [place] [address]  CBC with Differential     Status: Abnormal   Collection Time: 08/19/22  3:15 PM  Result Value Ref Range   WBC 4.7 4.0 - 10.5 K/uL   RBC 2.57 (L) 3.87 - 5.11 MIL/uL   Hemoglobin 9.1 (L) 12.0 - 15.0 g/dL   HCT 27.4  (L) 36.0 - 46.0 %   MCV 106.6 (H) 80.0 - 100.0 fL   MCH 35.4 (H) 26.0 - 34.0 pg   MCHC 33.2 30.0 - 36.0 g/dL   RDW 17.0 (H) 11.5 - 15.5 %   Platelets 26 (LL) 150 - 400 K/uL    Comment: Immature Platelet Fraction may be clinically indicated, consider ordering this additional test [id number] REPEATED TO VERIFY THIS CRITICAL RESULT HAS VERIFIED AND BEEN CALLED TO BAILEY B RN BY MELISSA BOOSTEDT ON 08 20 2023 AT 1609, AND HAS BEEN READ BACK.     nRBC 0.0 0.0 - 0.2 %   Neutrophils Relative % 87 %   Neutro Abs 4.1 1.7 - 7.7 K/uL   Lymphocytes Relative 6 %   Lymphs Abs 0.3 (L) 0.7 - 4.0 K/uL   Monocytes Relative 6 %   Monocytes Absolute 0.3 0.1 - 1.0 K/uL   Eosinophils Relative 0 %   Eosinophils Absolute 0.0 0.0 - 0.5 K/uL   Basophils Relative 0 %   Basophils Absolute 0.0 0.0 - 0.1 K/uL   Immature Granulocytes 1 %   Abs Immature Granulocytes 0.03 0.00 - 0.07 K/uL    Comment: Performed at [facility] [address], [place], [place] [address]  Comprehensive metabolic panel     Status: Abnormal   Collection Time: 08/19/22  3:15 PM  Result Value Ref Range   Sodium 132 (L) 135 - 145 mmol/L   Potassium 4.1 3.5 - 5.1 mmol/L   Chloride 92 (L) 98 - 111 mmol/L   CO2 28 22 - 32 mmol/L   Glucose, Bld 179 (H) 70 - 99 mg/dL    Comment: Glucose reference range applies only to samples taken after fasting for at least 8 hours.   BUN 52 (H) 8 - 23 mg/dL   Creatinine, Ser 5.35 (H) 0.44 - 1.00 mg/dL   Calcium 10.1 8.9 - 10.3 mg/dL   Total Protein 5.4 (L) 6.5 - 8.1 g/dL   Albumin 2.1 (L) 3.5 - 5.0 g/dL   AST 60 (H) 15 - 41 U/L   ALT 33 0 - 44 U/L   Alkaline Phosphatase 75 38 - 126 U/L   Total Bilirubin 2.2 (H) 0.3 - 1.2 mg/dL   GFR, Estimated 8 (L) >60 mL/min    Comment: (NOTE) Calculated using the CKD-EPI Creatinine Equation (2021)    Anion gap 12 5 - 15    Comment: Performed at [facility], [place] [address] [place], [place] [address]  Magnesium     Status: Abnormal   Collection  Time: 08/19/22  3:15 PM  Result Value Ref Range   Magnesium 2.9 (H) 1.7 - 2.4 mg/dL    Comment: Performed at [place],  [place] [address]  Lactic acid, plasma     Status: Abnormal   Collection Time: 08/19/22  3:15 PM  Result Value Ref Range   Lactic Acid, Venous 2.1 (HH) 0.5 - 1.9 mmol/L    Comment: CRITICAL RESULT CALLED TO, READ BACK BY AND VERIFIED WITH B.BENTON RN @ 2831 08/19/22 BY C.EDENS Performed at [facility], [place] [address], [place],  [address]   ABO/Rh     Status: None   Collection Time: 08/19/22  3:15 PM  Result Value Ref Range   ABO/RH(D)      O POS Performed at [facility] [address] [place], [place] [address]   Lactic acid, plasma     Status: Abnormal   Collection Time: 08/19/22  6:12 PM  Result Value Ref Range   Lactic Acid, Venous 2.6 (HH) 0.5 - 1.9 mmol/L    Comment: CRITICAL VALUE NOTED. VALUE IS CONSISTENT WITH PREVIOUSLY REPORTED/CALLED VALUE Performed at [facility], [place] [address], [place],  [address]   Ammonia     Status: None   Collection Time: 08/19/22  6:12 PM  Result Value Ref Range   Ammonia 27 9 - 35 umol/L    Comment: Performed at [facility], [place] [address] [place],  [address]  DIC Panel ONCE - STAT     Status: Abnormal   Collection Time: 08/19/22  6:55 PM  Result Value Ref Range   Prothrombin Time 62.8 (H) 11.4 - 15.2 seconds   INR 7.4 (HH) 0.8 - 1.2    Comment: REPEATED TO VERIFY CRITICAL RESULT CALLED TO, READ BACK BY AND VERIFIED WITH: Called to Jimmey Ralph, RN @ 2028 08/19/2022 by S.Stanley (NOTE) INR goal varies based on device and disease states.    aPTT 62 (H) 24 - 36 seconds    Comment:        IF BASELINE aPTT IS ELEVATED, SUGGEST PATIENT RISK ASSESSMENT BE USED TO DETERMINE APPROPRIATE ANTICOAGULANT THERAPY. CORRECTED ON 08/20 AT 2028: PREVIOUSLY REPORTED AS 62        IF BASELINE aPTT IS ELEVATED, SUGGEST PATIENT RISK ASSESSMENT BE USED  TO DETERMINE APPROPRIATE ANTICOAGULANT THERAPY.    Fibrinogen 338 210 - 475 mg/dL    Comment: (NOTE) Fibrinogen results may be underestimated in patients receiving thrombolytic therapy. CORRECTED ON 08/20 AT 2028: PREVIOUSLY REPORTED AS 338    D-Dimer, Quant 3.19 (H) 0.00 - 0.50 ug/mL-FEU    Comment: (NOTE) At the manufacturer cut-off value of 0.5 g/mL FEU, this assay has a negative predictive value of 95-100%.This assay is intended for use in conjunction with a clinical pretest probability (PTP) assessment model to exclude pulmonary embolism (PE) and deep venous thrombosis (DVT) in outpatients suspected of PE or DVT. Results should be correlated with clinical presentation.    Platelets 21 (LL) 150 - 400 K/uL    Comment: REPEATED TO VERIFY THIS CRITICAL RESULT HAS VERIFIED AND BEEN CALLED TO BAILEY BENTON,RN BY PAMELA HENDERSON ON 08 20 2023 AT 2018, AND HAS BEEN READ BACK.     Smear Review NO SCHISTOCYTES SEEN     Comment: Performed at [facility], [place] [address], [place], [place] [address]  Lactate dehydrogenase     Status: None   Collection Time: 08/19/22  6:55 PM  Result Value Ref Range   LDH 181 98 - 192 U/L    Comment: Performed at [facility], [place] [address], [place], [place] [address]  Iron and TIBC  Status: Abnormal   Collection Time: 08/19/22  6:55 PM  Result Value Ref Range   Iron 23 (L) 28 - 170 ug/dL   TIBC UNABLE TO CALCULATE 250 - 450 ug/dL    Comment: TRANSFERRIN <70   Saturation Ratios UNABLE TO CALCULATE 10.4 - 31.8 %    Comment: TRANSFERRIN <70   UIBC UNABLE TO CALCULATE ug/dL    Comment: TRANSFERRIN <70 Performed at [facility] [address], [place], [place] [address]   Ferritin (Iron Binding Protein)     Status: Abnormal   Collection Time: 08/19/22  6:55 PM  Result Value Ref Range   Ferritin 815 (H) 11 - 307 ng/mL    Comment: Performed at [facility], [place] [address] [place], [place] [address]  Type and screen [place]     Status: None   Collection Time: 08/19/22 10:31 PM  Result Value Ref Range   ABO/RH(D) O POS    Antibody Screen NEG    Sample Expiration      08/22/2022,2359 Performed at [facility], [place] [address] [place], [place] [address]   Blood gas, venous     Status: Abnormal   Collection Time: 08/19/22 10:37 PM  Result Value Ref Range   pH, Ven 7.44 (H) 7.25 - 7.43   pCO2, Ven 52 44 - 60 mmHg   pO2, Ven 47 (H) 32 - 45 mmHg   Bicarbonate 35.3 (H) 20.0 - 28.0 mmol/L   Acid-Base Excess 9.4 (H) 0.0 - 2.0 mmol/L   O2 Saturation 78.4 %   Patient temperature 37.0    Drawn by 164     Comment: Performed at [facility] [address], [place], [place] [address]  CK     Status: None   Collection Time: 08/19/22 10:37 PM  Result Value Ref Range   Total CK 48 38 - 234 U/L    Comment: Performed at [facility], [place] [address], [place], [place] [address]  Phosphorus     Status: None   Collection Time: 08/19/22 10:37 PM  Result Value Ref Range   Phosphorus 4.0 2.5 - 4.6 mg/dL    Comment: Performed at [facility], [place] [address], [place], [place] [address]  Osmolality     Status: Abnormal   Collection Time: 08/19/22 10:37 PM  Result Value Ref Range   Osmolality 301 (H) 275 - 295 mOsm/kg    Comment: Performed at [facility], [place] [address] [place], [place] [address]  Vitamin B12     Status: Abnormal   Collection Time: 08/19/22 10:37 PM  Result Value Ref Range   Vitamin B-12 2,426 (H) 180 - 914 pg/mL    Comment: (NOTE) This assay is not validated for testing neonatal or myeloproliferative syndrome specimens for Vitamin B12 levels. Performed at [facility], [place] [address], [place], [place] [address]   Folate     Status: None   Collection Time: 08/19/22 10:37 PM  Result Value Ref Range   Folate 31.6 >5.9 ng/mL    Comment: Performed at [facility] [address] [place], [place] [address]  Reticulocytes     Status:  Abnormal   Collection Time: 08/19/22 10:37 PM  Result Value Ref Range   Retic Ct Pct 5.7 (H) 0.4 - 3.1 %   RBC. 2.84 (L) 3.87 - 5.11 MIL/uL   Retic Count, Absolute 160.7 19.0 - 186.0 K/uL  Immature Retic Fract 30.1 (H) 2.3 - 15.9 %    Comment: Performed at [facility], [place] [address] [place], [place] [address]  HIV Antibody (routine testing w rflx)     Status: None   Collection Time: 08/19/22 10:37 PM  Result Value Ref Range   HIV Screen 4th Generation wRfx Non Reactive Non Reactive    Comment: Performed at [facility], [place] [address], [place], [place] [address]  CBG monitoring, ED     Status: Abnormal   Collection Time: 08/19/22 11:44 PM  Result Value Ref Range   Glucose-Capillary 161 (H) 70 - 99 mg/dL    Comment: Glucose reference range applies only to samples taken after fasting for at least 8 hours.  Lactic acid, plasma     Status: Abnormal   Collection Time: 08/19/22 11:58 PM  Result Value Ref Range   Lactic Acid, Venous 2.0 (HH) 0.5 - 1.9 mmol/L    Comment: CRITICAL VALUE NOTED. VALUE IS CONSISTENT WITH PREVIOUSLY REPORTED/CALLED VALUE Performed at [facility], [place] [address], [place], [place] [address]   CBG monitoring, ED     Status: Abnormal   Collection Time: 08/20/22  4:36 AM  Result Value Ref Range   Glucose-Capillary 128 (H) 70 - 99 mg/dL    Comment: Glucose reference range applies only to samples taken after fasting for at least 8 hours.  Prealbumin     Status: Abnormal   Collection Time: 08/20/22  5:00 AM  Result Value Ref Range   Prealbumin <5 (L) 18 - 38 mg/dL    Comment: Performed at [facility] [address], [place], [place] [address]  Comprehensive metabolic panel     Status: Abnormal   Collection Time: 08/20/22  5:00 AM  Result Value Ref Range   Sodium 131 (L) 135 - 145 mmol/L   Potassium 3.9 3.5 - 5.1 mmol/L    Comment: HEMOLYSIS AT THIS LEVEL MAY AFFECT RESULT   Chloride 93 (L) 98 - 111 mmol/L   CO2 28 22 - 32 mmol/L    Glucose, Bld 125 (H) 70 - 99 mg/dL    Comment: Glucose reference range applies only to samples taken after fasting for at least 8 hours.   BUN 60 (H) 8 - 23 mg/dL   Creatinine, Ser 5.56 (H) 0.44 - 1.00 mg/dL   Calcium 9.7 8.9 - 10.3 mg/dL   Total Protein 5.2 (L) 6.5 - 8.1 g/dL   Albumin 2.2 (L) 3.5 - 5.0 g/dL   AST 65 (H) 15 - 41 U/L    Comment: HEMOLYSIS AT THIS LEVEL MAY AFFECT RESULT   ALT 28 0 - 44 U/L    Comment: HEMOLYSIS AT THIS LEVEL MAY AFFECT RESULT   Alkaline Phosphatase 62 38 - 126 U/L   Total Bilirubin 1.9 (H) 0.3 - 1.2 mg/dL    Comment: HEMOLYSIS AT THIS LEVEL MAY AFFECT RESULT   GFR, Estimated 8 (L) >60 mL/min    Comment: (NOTE) Calculated using the CKD-EPI Creatinine Equation (2021)    Anion gap 10 5 - 15    Comment: Performed at [facility], [place] [address], [place],  [address]  CBC     Status: Abnormal   Collection Time: 08/20/22  5:00 AM  Result Value Ref Range   WBC 4.2 4.0 - 10.5 K/uL   RBC 2.26 (L) 3.87 - 5.11 MIL/uL   Hemoglobin 7.7 (L) 12.0 - 15.0 g/dL   HCT 23.9 (L) 36.0 -  46.0 %   MCV 105.8 (H) 80.0 - 100.0 fL   MCH 34.1 (H) 26.0 - 34.0 pg   MCHC 32.2 30.0 - 36.0 g/dL   RDW 16.9 (H) 11.5 - 15.5 %   Platelets 22 (LL) 150 - 400 K/uL    Comment: REPEATED TO VERIFY THIS CRITICAL RESULT HAS VERIFIED AND BEEN CALLED TO RN THOMPSON BY PAMELA HENDERSON ON 08 21 2023 AT 0626, AND HAS BEEN READ BACK.     nRBC 0.0 0.0 - 0.2 %    Comment: Performed at [facility], [place] [address], [place], [place] [address]  Hepatitis panel, acute     Status: None   Collection Time: 08/20/22  5:00 AM  Result Value Ref Range   Hepatitis B Surface Ag NON REACTIVE NON REACTIVE   HCV Ab NON REACTIVE NON REACTIVE    Comment: (NOTE) Nonreactive HCV antibody screen is consistent with no HCV infections,  unless recent infection is suspected or other evidence exists to indicate HCV infection.     Hep A IgM NON REACTIVE NON REACTIVE   Hep B C IgM NON REACTIVE  NON REACTIVE    Comment: Performed at [facility], [place] [address] [place], [place] [address]  Protime-INR     Status: Abnormal   Collection Time: 08/20/22  5:00 AM  Result Value Ref Range   Prothrombin Time 57.9 (H) 11.4 - 15.2 seconds   INR 6.7 (HH) 0.8 - 1.2    Comment: REPEATED TO VERIFY CRITICAL RESULT CALLED TO, READ BACK BY AND VERIFIED WITH: NOTIFIED ARIANNA T. RN AT [id number] BY RAMEL CUENCA (NOTE) INR goal varies based on device and disease states. Performed at [facility], [place] [address], [place], [place] [address]   C-reactive protein     Status: Abnormal   Collection Time: 08/20/22  5:00 AM  Result Value Ref Range   CRP 18.0 (H) <1.0 mg/dL    Comment: Performed at [facility], [place] [address] [place] [address], [place], [place] [address]  Magnesium     Status: Abnormal   Collection Time: 08/20/22  5:00 AM  Result Value Ref Range   Magnesium 2.9 (H) 1.7 - 2.4 mg/dL    Comment: Performed at [facility] [address], [place], [place] [address]  Brain natriuretic peptide     Status: Abnormal   Collection Time: 08/20/22  5:00 AM  Result Value Ref Range   B Natriuretic Peptide 705.0 (H) 0.0 - 100.0 pg/mL    Comment: Performed at [facility] [address] [place], [place] [address]  Technologist smear review     Status: None   Collection Time: 08/20/22  5:00 AM  Result Value Ref Range   WBC MORPHOLOGY MORPHOLOGY UNREMARKABLE    RBC MORPHOLOGY POLYCHROMASIA PRESENT     Comment: OVALOCYTES   Plt Morphology PLATELETS APPEAR DECREASED    Clinical Information thrombocytopenia     Comment: Performed at [facility], [place] [address], [place], [place] [address]  CBC with Differential/Platelet     Status: Abnormal   Collection Time: 08/20/22  5:00 AM  Result Value Ref Range   WBC 4.2 4.0 - 10.5 K/uL   RBC 2.24 (L) 3.87 - 5.11 MIL/uL   Hemoglobin 7.9 (L) 12.0 - 15.0 g/dL   HCT 24.0 (L) 36.0 - 46.0 %   MCV 107.1 (H) 80.0 - 100.0 fL   MCH 35.3  (H) 26.0 - 34.0 pg  MCHC 32.9 30.0 - 36.0 g/dL   RDW 17.0 (H) 11.5 - 15.5 %   Platelets 22 (LL) 150 - 400 K/uL    Comment: Immature Platelet Fraction may be clinically indicated, consider ordering this additional test [id number] CRITICAL VALUE NOTED.  VALUE IS CONSISTENT WITH PREVIOUSLY REPORTED AND CALLED VALUE. REPEATED TO VERIFY    nRBC 0.0 0.0 - 0.2 %   Neutrophils Relative % 84 %   Neutro Abs 3.5 1.7 - 7.7 K/uL   Lymphocytes Relative 6 %   Lymphs Abs 0.2 (L) 0.7 - 4.0 K/uL   Monocytes Relative 9 %   Monocytes Absolute 0.4 0.1 - 1.0 K/uL   Eosinophils Relative 0 %   Eosinophils Absolute 0.0 0.0 - 0.5 K/uL   Basophils Relative 0 %   Basophils Absolute 0.0 0.0 - 0.1 K/uL   Immature Granulocytes 1 %   Abs Immature Granulocytes 0.04 0.00 - 0.07 K/uL    Comment: Performed at [facility] [address] [place], [place] [address]  CBG monitoring, ED     Status: Abnormal   Collection Time: 08/20/22  8:14 AM  Result Value Ref Range   Glucose-Capillary 119 (H) 70 - 99 mg/dL    Comment: Glucose reference range applies only to samples taken after fasting for at least 8 hours.   Comment 1 Document in Chart   Hepatitis B surface antigen     Status: None   Collection Time: 08/20/22 11:28 AM  Result Value Ref Range   Hepatitis B Surface Ag NON REACTIVE NON REACTIVE    Comment: Performed at [facility] [address], [place], [place] [address]  Hepatitis B surface antibody     Status: None   Collection Time: 08/20/22 11:28 AM  Result Value Ref Range   Hep B S Ab NON REACTIVE NON REACTIVE    Comment: (NOTE) Inconsistent with immunity, less than 10 mIU/mL.  Performed at [facility], [place] [address], [place], [place] [address]   Hepatitis B core antibody, total     Status: None   Collection Time: 08/20/22 11:28 AM  Result Value Ref Range   Hep B Core Total Ab NON REACTIVE NON REACTIVE    Comment: Performed at [facility] [address]  [place], [place] [address]  Hepatitis C antibody     Status: None   Collection Time: 08/20/22 11:28 AM  Result Value Ref Range   HCV Ab NON REACTIVE NON REACTIVE    Comment: (NOTE) Nonreactive HCV antibody screen is consistent with no HCV infections,  unless recent infection is suspected or other evidence exists to indicate HCV infection.  Performed at [facility], [place] [address], [place], [place] [address]   CBG monitoring, ED     Status: Abnormal   Collection Time: 08/20/22 12:09 PM  Result Value Ref Range   Glucose-Capillary 131 (H) 70 - 99 mg/dL    Comment: Glucose reference range applies only to samples taken after fasting for at least 8 hours.  CBG monitoring, ED     Status: Abnormal   Collection Time: 08/20/22  4:31 PM  Result Value Ref Range   Glucose-Capillary 123 (H) 70 - 99 mg/dL    Comment: Glucose reference range applies only to samples taken after fasting for at least 8 hours.   DG Chest Port 1 View  Result Date: 08/20/2022 CLINICAL DATA:  72 year old female with history of altered mental status. EXAM: PORTABLE CHEST 1 VIEW  COMPARISON:  Chest x-ray 08/19/2022. FINDINGS: Lung volumes are low. No consolidative airspace disease. There is cephalization of the pulmonary vasculature and slight indistinctness of the interstitial markings suggestive of mild pulmonary edema. Trace right pleural effusion. No definite left pleural effusion. Mild cardiomegaly. The patient is rotated to the right on today's exam, resulting in distortion of the mediastinal contours and reduced diagnostic sensitivity and specificity for mediastinal pathology. Atherosclerotic calcifications in the thoracic aorta. IMPRESSION: 1. The appearance the chest suggests mild congestive heart failure, as above. 2. Aortic atherosclerosis. Electronically Signed   By: Vinnie Langton M.D.   On: 08/20/2022 06:54   CT CHEST ABDOMEN PELVIS W CONTRAST  Result Date: 08/19/2022 CLINICAL DATA:  Sepsis. EXAM: CT CHEST,  ABDOMEN, AND PELVIS WITH CONTRAST TECHNIQUE: Multidetector CT imaging of the chest, abdomen and pelvis was performed following the standard protocol during bolus administration of intravenous contrast. RADIATION DOSE REDUCTION: This exam was performed according to the departmental dose-optimization program which includes automated exposure control, adjustment of the mA and/or kV according to patient size and/or use of iterative reconstruction technique. CONTRAST:  49m OMNIPAQUE IOHEXOL 300 MG/ML  SOLN COMPARISON:  Chest radiograph earlier today. FINDINGS: CT CHEST FINDINGS Cardiovascular: Aortic atherosclerosis and tortuosity without aneurysm or acute aortic findings. No obvious central pulmonary embolus on this exam not tailored for pulmonary arteries S mint. Borderline cardiomegaly. There are coronary artery calcifications. No pericardial effusion. Mediastinum/Nodes: No mediastinal adenopathy or mass. No hilar adenopathy. Decompressed esophagus. No visible thyroid nodule. Lungs/Pleura: Motion artifact through the bases. Dependent atelectasis in the right greater than left lower lobe. No confluent consolidation. Mild central bronchial thickening without endobronchial lesion. No significant pleural effusion. Musculoskeletal: Remote right proximal humerus fracture with posttraumatic deformity. Exaggerated thoracic kyphosis. The bones are diffusely under mineralized. Mild T6 compression deformity. No posterior cortex involvement. CT ABDOMEN PELVIS FINDINGS Hepatobiliary: Nodular hepatic contours consistent with cirrhosis. There is no discrete focal hepatic lesion. Motion artifact through the liver limits assessment. Innumerable calcified gallstones. No definite pericholecystic inflammation. No biliary dilatation, although the common bile duct is poorly defined. Pancreas: No ductal dilatation or inflammation. Spleen: Splenomegaly with spleen measuring 15.1 x 6 x 13.4 cm (volume = 600 cm^3). Small subcapsular  low-density posteriorly series 3, image 49, nonspecific. Adrenals/Urinary Tract: No adrenal nodule. Bilateral renal parenchymal atrophy. No hydronephrosis or focal renal lesion. Urinary bladder is obscured by streak artifact from left hip arthroplasty. 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 The stomach is decompressed. There is no small bowel obstruction or inflammation. Small to moderate volume of colonic stool without colonic inflammation. The appendix is not visualized. Vascular/Lymphatic: Moderate to advanced aortic and branch atherosclerosis. No aortic aneurysm. There is no evidence of portal vein thrombosis, although phase of contrast limits portal vein assessment. Left upper quadrant collaterals with splenorenal shunting. Reproductive: Uterus primarily obscured by streak artifact from left hip arthroplasty. There is no obvious adnexal mass. Other: Minimal perihepatic ascites. Mild generalized edema of the subcutaneous and intra-abdominal fat. No free air. Surgical staples in the anterior abdominal wall. Musculoskeletal: Bones diffusely under mineralized. Marked compression deformity of L2 and L3 with buckling of the posterior cortex. Moderate L5 compression fracture. There is a fracture through the superior aspect of L1 vertebral body that is age indeterminate. Left hip arthroplasty. IMPRESSION: 1. Hepatic cirrhosis. Splenomegaly and left upper quadrant collaterals and splenorenal shunting. Minimal perihepatic ascites. 2. Cholelithiasis without definite pericholecystic inflammation. 3. Dependent atelectasis in both lungs.  No pneumonia. 4. Cardiomegaly.  Coronary artery calcifications. 5. Multiple thoracic and  lumbar compression deformities. Marked compression deformity of L2 and L3 with buckling of the posterior cortex. Moderate L5 compression fracture. There is a fracture through the superior aspect of L1 vertebral body that is age indeterminate. Mild T6 compression fracture. Recommend correlation  with focal tenderness. Aortic Atherosclerosis (ICD10-I70.0). Electronically Signed   By: Keith Rake M.D.   On: 08/19/2022 18:56   CT Head Wo Contrast  Result Date: 08/19/2022 CLINICAL DATA:  Mental status change, unknown cause. EXAM: CT HEAD WITHOUT CONTRAST TECHNIQUE: Contiguous axial images were obtained from the base of the skull through the vertex without intravenous contrast. RADIATION DOSE REDUCTION: This exam was performed according to the departmental dose-optimization program which includes automated exposure control, adjustment of the mA and/or kV according to patient size and/or use of iterative reconstruction technique. COMPARISON:  None Available. FINDINGS: Brain: No evidence of acute infarction, hemorrhage, hydrocephalus, extra-axial collection or mass lesion/mass effect. Prominence of the ventricles and sulci secondary to mild cerebral volume loss. Patchy areas of low-attenuation presumed chronic microvascular ischemic changes. Vascular: No hyperdense vessel or unexpected calcification. Skull: Normal. Negative for fracture or focal lesion. Sinuses/Orbits: No acute finding. Other: None. IMPRESSION: 1.  No acute intracranial abnormality. 2. Mild cerebral volume loss and chronic microvascular ischemic changes of the white matter. Electronically Signed   By: Keane Police D.O.   On: 08/19/2022 16:48   DG Chest Portable 1 View  Result Date: 08/19/2022 CLINICAL DATA:  Altered mental status EXAM: PORTABLE CHEST 1 VIEW COMPARISON:  Chest radiograph 05/01/2022 FINDINGS: The heart is enlarged, unchanged. The upper mediastinal contours are stable. There is no focal consolidation or pulmonary edema. There is no pleural effusion or pneumothorax There is no acute osseous abnormality. IMPRESSION: Unchanged cardiomegaly.  No focal consolidation or pleural effusion. Electronically Signed   By: Valetta Mole M.D.   On: 08/19/2022 16:25    Pending Labs Unresulted Labs (From admission, onward)     Start      Ordered   08/21/22 0500  CBC with Differential/Platelet  Daily at 5am,   R     Question:  Specimen collection method  Answer:  Lab=Lab collect   08/20/22 0623   08/21/22 0500  Comprehensive metabolic panel  Daily at 5am,   R     Question:  Specimen collection method  Answer:  Lab=Lab collect   08/20/22 0623   08/21/22 0500  Magnesium  Daily at 5am,   R     Question:  Specimen collection method  Answer:  Lab=Lab collect   08/20/22 0623   08/21/22 0500  Brain natriuretic peptide  Daily at 5am,   R     Question:  Specimen collection method  Answer:  Lab=Lab collect   08/20/22 0623   08/21/22 0500  Protime-INR  Daily at 5am,   R      08/20/22 0829   08/20/22 1128  Hepatitis B surface antibody,quantitative  (New Admission [facility] (Hepatitis B))  Once,   R        08/20/22 1129   08/19/22 2115  Hemoglobin A1c  Once,   R       Comments: To assess prior glycemic control    08/19/22 2114   08/19/22 2106  Rapid urine drug screen (hospital performed)  ONCE - STAT,   STAT        08/19/22 2105   08/19/22 2101  Osmolality, urine  Once,   URGENT        08/19/22 2100   08/19/22 2100  Lactic acid, plasma  STAT Now then every 3 hours,   R      08/19/22 2101   08/19/22 2008  Urine Culture  Once,   URGENT       Question:  Indication  Answer:  Sepsis   08/19/22 2007   08/19/22 1836  Haptoglobin  Once,   URGENT        08/19/22 1835            Vitals/Pain Today's Vitals   08/20/22 1800 08/20/22 1815 08/20/22 1830 08/20/22 1848  BP: (!) 106/48 (!) 105/46 (!) 102/49   Pulse: 70 69 71   Resp:   16   Temp:    (!) 97.4 F (36.3 C)  TempSrc:    Oral  SpO2: 100% 100% 99%   PainSc:        Isolation Precautions No active isolations  Medications Medications  insulin aspart (novoLOG) injection 0-6 Units ( Subcutaneous Not Given 08/20/22 1640)  cefTRIAXone (ROCEPHIN) 1 g in sodium chloride 0.9 % 100 mL IVPB (has no administration in time range)  acetaminophen (TYLENOL) tablet 650 mg (has  no administration in time range)    Or  acetaminophen (TYLENOL) suppository 650 mg (has no administration in time range)  midodrine (PROAMATINE) tablet 10 mg (10 mg Oral Given 08/20/22 1812)  methocarbamol (ROBAXIN) 500 mg in dextrose 5 % 50 mL IVPB (has no administration in time range)  ondansetron (ZOFRAN) injection 4 mg (4 mg Intravenous Given 08/20/22 0230)  pantoprazole (PROTONIX) EC tablet 40 mg (40 mg Oral Given 08/20/22 0924)  traMADol (ULTRAM) tablet 50 mg (50 mg Oral Given 08/20/22 0928)  Chlorhexidine Gluconate Cloth 2 % PADS 6 each (has no administration in time range)  cholecalciferol (VITAMIN D3) 25 MCG (1000 UNIT) tablet 1,000 Units (1,000 Units Oral Given 08/20/22 1535)  HYDROmorphone (DILAUDID) tablet 1 mg (1 mg Oral Given 08/20/22 1814)  pravastatin (PRAVACHOL) tablet 20 mg (20 mg Oral Given 08/20/22 1813)  sertraline (ZOLOFT) tablet 50 mg (50 mg Oral Given 08/20/22 1535)  lactated ringers bolus 1,000 mL (0 mLs Intravenous Stopped 08/19/22 1927)  iohexol (OMNIPAQUE) 300 MG/ML solution 80 mL (80 mLs Intravenous Contrast Given 08/19/22 1836)  lactated ringers bolus 500 mL (0 mLs Intravenous Stopped 08/20/22 0108)  cefTRIAXone (ROCEPHIN) 1 g in sodium chloride 0.9 % 100 mL IVPB (0 g Intravenous Stopped 08/19/22 2227)  albumin human 25 % solution 25 g (0 g Intravenous Stopped 08/20/22 0109)  phytonadione (VITAMIN K) SQ injection 2 mg (2 mg Subcutaneous Given 08/20/22 0119)  midodrine (PROAMATINE) tablet 10 mg (10 mg Oral Given 08/20/22 0130)  sodium chloride 0.9 % bolus 250 mL (0 mLs Intravenous Stopped 08/20/22 0647)  phytonadione (VITAMIN K) 1 mg in dextrose 5 % 50 mL IVPB (0 mg Intravenous Stopped 08/20/22 1042)    Mobility non-ambulatory High fall risk   Focused Assessments   R Recommendations: See Admitting Provider Note  Report given to:   Additional Notes:

## 2022-08-20 NOTE — Consult Note (Signed)
Renal Service Consult Note [facility]  Dorothy Cooper 08/20/2022 Sol Blazing, MD Requesting Physician: Dr. Candiss Norse  Reason for Consult: ESRD pt w/ AMS HPI: The patient is a 72 y.o. year-old w/ hx of CHF, cirrhosis, atrial fib, DM2, HTN and ESRD on HD presented to ED from SNF for altered mental status. Pt more tired than usual, not speaking much and slow to follow commands. Pt moved to [facility] 3-4 mos ago. Has hx of splenomegaly and liver disease w/ thrombocytopenia in the past, cause of liver disease not known. 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 Pt did get her HD on Saturday 8/19. In ED BP soft 96/40, HR 72, 93% RA.  Pt was admitted for AMS, poss UTI, NH3 sent. We are asked to see for ESRD.    Pt seen in ED. Pt is elderly and very poor historian. Resists exam, diffusely tender to touch on the LE's. No PTA meds listed yet.   ROS - n/a  Past Medical History  Past Medical History:  Diagnosis Date   Anemia    CHF (congestive heart failure) (HCC)    Chronic kidney disease    Diabetes mellitus without complication (HCC)    Dysrhythmia    Hypertension    Past Surgical History  Family History  Family History  Problem Relation Age of Onset   CAD Other    Hypertension Other    Social History  reports that she has quit smoking. Her smoking use included cigarettes. She has never used smokeless tobacco. She reports that she does not currently use alcohol. She reports that she does not use drugs. Allergies  Allergies  Allergen Reactions   Penicillins Anaphylaxis   Amlodipine    Aspirin     Throat swells   Gabapentin    Home medications Prior to Admission medications   Not on File     Vitals:   08/20/22 0345 08/20/22 0415 08/20/22 0500 08/20/22 0515  BP: (!) 97/52 (!) 95/43 (!) 93/44 (!) 97/45  Pulse: 72 74 71 73  Resp:      Temp:      TempSrc:      SpO2: 91% 93% 94% 92%   Exam Gen elderly chron ill appearing, appears to be in pain/ uncomfortable w/ any moving  around or palpating No rash, cyanosis or gangrene Sclera anicteric, throat clear  No jvd or bruits Chest clear bilat to bases, no rales/ wheezing RRR no MRG Abd soft ntnd no mass or ascites +bs GU defer MS no joint effusions or deformity Ext no LE or UE edema, very thick skin, tender all about the LEs, but no wounds or erythema or ulcers (did not examine backside) Neuro is lethargic, confused, groggy, deconditioned    LUA AVF+bruit   Home meds include - pending   OP HD: [facility] 3h 41mn  400/500  62kg  2/2 bath P4   Hep 2000  AVF LUA - mircera 200 q2, last 8/12, due 8/26 - doxercalciferol 1 ug IV tiw - last HD 8/19, min idwg 0- 1kg, 5 kg drop in edw last wk - last Hb 10.8 on 8/15, tsat 57%   CXR 8/21 - possibly mild CHF  Assessment/ Plan: AMS - +low grade temp, possible UTI. LA up, started on Rocephin and given 1 L LR. NH3 28. Per pmd.  Hypotension - sepsis vs other. Hb is not real low. Don't kwow outpt meds yet Vertebral compression fx(s) - multiple per CT, not surgical candidate per NSGY ESRD -  on HD TTS. Has not missed HD, good compliance. HD tomorrow.  BP/ vol - soft BP's, losing body wt with a 4kg drop in edw last week. Skin is very thick vs some edema dependent LE's. CXR poss early CHF, but to me CXR is not changed from prior. Doubt will tolerate UF w/ low BPs.  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 Last tsat high.  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 Will hold IV vdra for now.  Cirrhosis - per CT scan today      Kelly Splinter  MD 08/20/2022, 6:01 AM Recent Labs  Lab 08/19/22 1515 08/19/22 2237  HGB 9.1*  --   ALBUMIN 2.1*  --   CALCIUM 10.1  --   PHOS  --  4.0  CREATININE 5.35*  --   K 4.1  --

## 2022-08-20 NOTE — Progress Notes (Signed)
SLP Cancellation Note  Patient Details Name: Dorothy Cooper MRN: [id number] DOB: September 22, 1950  [name] passed the RN swallow screen; therefore, her diet can be advanced without SLP swallow eval. We will sign off. If we can be of service during her admission, please re-consult.   Thank you, Martavious Hartel L. Tivis Ringer, MA CCC/SLP Clinical Specialist - Acute Care SLP Acute Rehabilitation Services Office number [phone]                                                                                                  Juan Quam Laurice 08/20/2022, 3:25 PM

## 2022-08-20 NOTE — ED Notes (Signed)
Doutova MD made aware of pts BP 103/44 (58). Plan top gently rehydrate, 500 bolus changed to 250

## 2022-08-20 NOTE — Consult Note (Addendum)
Consultation Note Date: 08/20/2022   Patient Name: Dorothy Cooper  DOB: 1950/12/18  MRN: [id number]  Age / Sex: 72 y.o., female  PCP: Pcp, No Referring Physician: Thurnell Lose, MD  Reason for Consultation: Establishing goals of care  HPI/Patient Profile: 72 y.o. female  with past medical history of end-stage renal disease on hemodialysis Tuesday/Thursday/ Saturday, thrombocytopenia, cirrhosis  unclear reason, diastolic CHF, chronic anemia, asthma, COPD, DM2, glaucoma, HLD, HTN pancytopenia, on eliquis for A.fib, migraine headaches presented to ED on 08/19/22 from [facility] with AMS. Patient was admitted on 08/19/2022 with acute metabolic encephalopathy, UTI, cirrhosis, hypotension, thrombocytopenia, cirrhosis, L and T-spine compression fracture. Neurosurgery states patient is not a surgical candidate; only option is TLSO brace. Hematology consult is pending.   Clinical Assessment and Goals of Care: I have reviewed medical records including EPIC notes, labs, and imaging. Received report from primary RN - no acute concerns. RN reports patient is confused.    Went to visit patient at bedside - No family/visitors present. Patient was lying in bed awake, alert, oriented to self only, and unable to participate in meaningful conversation. She is not able to make complex medical decisions. Her speech is slurred and almost unintelligible. No signs or non-verbal gestures of pain or discomfort noted. No respiratory distress, increased work of breathing, or secretions noted. She denies pain.  1:45 PM Called granddaughter/Dorothy Cooper to discuss diagnosis, prognosis, GOC, EOL wishes, disposition, and options - she tells me she is bringing patient's two sisters to hospital this afternoon and requests in person meeting. [name] states patient does not have a Living Will or HCPOA; however, she has been overseeing patient's care for the  last 7-8 years. Patient's sisters "don't have much to do with her" but [name] is hopeful to get everyone on the same page. Patient is also legally married but has been separated for 9 years - family have not had contact with him and have been unable to locate him in the past. Family meeting scheduled for 4:15 PM.  4:15 PM Met with granddaughter/Dorothy Cooper, daughter/Dorothy Cooper, daughter/Dorothy Cooper  to discuss diagnosis, prognosis, GOC, EOL wishes, disposition, and options.  I introduced Palliative Medicine as specialized medical care for people living with serious illness. It focuses on providing relief from the symptoms and stress of a serious illness. The goal is to improve quality of life for both the patient and the family.  We discussed a brief life review of the patient as well as functional and nutritional status. Patient is married as outlined above - family confirm patient and husband are estranged for 9 years. She has two daughters who are present at bedside. Prior to hospitalization, patient was living at [facility] - she has been there for 5 months (since March). One month ago patient was alert, oriented, walking small distances and using a wheelchair. Family state she was laughing, keeping up with her own HD schedule with a "will to live." As of last Wednesday, they report patient was "perfectly fine." Thursday is when they noted a decline. By Sunday, patient was not interactive and no coherent.   We discussed patient's current illness and what it means in the larger context of patient's on-going co-morbidities. Education provided that CHF, COPD, CKD, and cirrhosis are progressive, non-curable diseases underlying the patient's current acute medical conditions. Patient's current acute medical situation was reviewed to the best of my ability. Education on MELD score provided. Reviewed multifactorial causes that may be contributing to patient's encephalopathy as well as compression fractures in  context of  quality of life with balance of pain control/confusion. Natural disease trajectory and expectations at EOL were discussed. I attempted to elicit values and goals of care important to the patient. The difference between aggressive medical intervention and comfort care was considered in light of the patient's goals of care.   Family are hopeful patient may improve enough to make her own decisions regarding her care; however, they do understand patient may not be able to make complex medical decisions going forward pending her clinical course. Goal is to continue medical treatment with watchful waiting. They are do want to continue HD as tolerated at this time. Family feel if patient has shown no significant improvement by Wednesday, they will be ready to make the decision for full comfort/hospice. They would prefer residential hospice in [place]. They have questions regarding cost of non-emergent transport - will notify TOC of their question.   We talked about transition to comfort measures in house and what that would entail inclusive of medications to control pain, dyspnea, agitation, nausea, and itching. We discussed stopping all unnecessary measures such as blood draws, needle sticks, oxygen, antibiotics, CBGs/insulin, cardiac monitoring, IVF, hemodialysis, and frequent vital signs.  Encouraged family to consider DNR/DNI status understanding evidenced based poor outcomes in similar hospitalized patient, as the cause of arrest is likely associated with advanced chronic/terminal illness rather than an easily reversible acute cardio-pulmonary event. I explained that DNR/DNI does not change the medical plan and it only comes into effect after a person has arrested (died).  It is a protective measure to keep [place] from harming the patient in their last moments of life. Family were agreeable to DNR/DNI with understanding that she would not receive CPR, defibrillation, ACLS medications, or intubation.    Throughout meeting patient continued to ask for oxygen despite saturations being mid-90s. 2L O2 [place] placed for comfort - patient became less restless.   Lab work reviewed with family in detail. Family would like to try and feed patient - reviewed patient passed bedside swallow screen. They request ensure; however, patient continually states she does not want any. Ensure provided to family who will encourage oral intake.  Discussed with family the importance of continued conversation with each other and the medical providers regarding overall plan of care and treatment options, ensuring decisions are within the context of the patient's values and GOCs.    Questions and concerns were addressed. The patient/family was encouraged to call with questions and/or concerns. PMT card was provided.   Primary Decision Maker: NEXT OF KIN - two daughters are legal decision makers; however, they lean on granddaughter to assist     SUMMARY OF RECOMMENDATIONS   Continue current medical treatment with watchful waiting Now DNR/DNI Family are hopeful patient may improve enough to make her own medical decisions; if no significant improvement by Wednesday 8/23 they will be ready for her transition to full comfort/hospice Ongoing GOC pending clinical course PMT will continue to follow and support holistically   Code Status/Advance Care Planning: DNR  Palliative Prophylaxis:  Aspiration, Bowel Regimen, Delirium Protocol, Frequent Pain Assessment, Oral Care, and Turn Reposition  Additional Recommendations (Limitations, Scope, Preferences): Full Scope Treatment and No Tracheostomy  Psycho-social/Spiritual:  Desire for further Chaplaincy support:no Created space and opportunity for patient and family to express thoughts and feelings regarding patient's current medical situation.  Emotional support and therapeutic listening provided.  Prognosis:  Unable to determine  Discharge Planning: To Be Determined       Primary  Diagnoses: Present on Admission:  Acute metabolic encephalopathy  UTI (urinary tract infection)  Cirrhosis (HCC)  Paroxysmal atrial fibrillation (HCC)  Elevated INR  Chronic diastolic CHF (congestive heart failure) (HCC)  Hypotension  ESRD (end stage renal disease) ([place])  Thrombocytopenia ([place])  Compression fracture of lumbar vertebra ([place])   I have reviewed the medical record, interviewed the patient and family, and examined the patient. The following aspects are pertinent.  Past Medical History:  Diagnosis Date   Anemia    CHF (congestive heart failure) (HCC)    Chronic kidney disease    Diabetes mellitus without complication ([place])    Dysrhythmia    Hypertension    Social History   Socioeconomic History   Marital status: Unknown    Spouse name: Not on file   Number of children: Not on file   Years of education: Not on file   Highest education level: Not on file  Occupational History   Not on file  Tobacco Use   Smoking status: Former    Types: Cigarettes   Smokeless tobacco: Never  Substance and Sexual Activity   Alcohol use: Not Currently   Drug use: Never   Sexual activity: Not on file  Other Topics Concern   Not on file  Social History Narrative   Not on file   Social Determinants of Health   Financial Resource Strain: Not on file  Food Insecurity: Not on file  Transportation Needs: Not on file  Physical Activity: Not on file  Stress: Not on file  Social Connections: Not on file   Family History  Problem Relation Age of Onset   CAD Other    Hypertension Other    Scheduled Meds:  Chlorhexidine Gluconate Cloth  6 each Topical Q0600   insulin aspart  0-6 Units Subcutaneous Q4H   midodrine  10 mg Oral TID WC   pantoprazole  40 mg Oral Daily   Continuous Infusions:  cefTRIAXone (ROCEPHIN)  IV     lactated ringers 100 mL/hr at 08/20/22 0909   methocarbamol (ROBAXIN) IV     PRN Meds:.acetaminophen **OR** acetaminophen,  methocarbamol (ROBAXIN) IV, ondansetron (ZOFRAN) IV, traMADol Medications Prior to Admission:  Prior to Admission medications   Medication Sig Start Date End Date Taking? Authorizing Provider  albuterol (VENTOLIN HFA) 108 (90 Base) MCG/ACT inhaler Inhale 2 puffs into the lungs every 6 (six) hours as needed for wheezing. 05/03/20  Yes [provider]  Amino Acids-Protein Hydrolys (FEEDING SUPPLEMENT, PRO-STAT SUGAR FREE 64,) LIQD Take 30 mLs by mouth daily.   Yes [provider]  calcium acetate (PHOSLO) 667 MG capsule Take 1,334 mg by mouth 3 (three) times daily. 06/22/22  Yes [provider]  Camphor-Menthol-Methyl Sal 04-09-29 % GEL Apply 1 Application topically in the morning, at noon, in the evening, and at bedtime.   Yes [provider]  carvedilol (COREG) 3.125 MG tablet Take 3.125 mg by mouth daily. 06/22/22  Yes [provider]  cholecalciferol (VITAMIN D3) 25 MCG (1000 UNIT) tablet Take 1,000 Units by mouth daily.   Yes [provider]  ELIQUIS 5 MG TABS tablet Take 5 mg by mouth 2 (two) times daily. 06/22/22  Yes [provider]  HYDROmorphone (DILAUDID) 2 MG tablet Take 2 mg by mouth every 6 (six) hours as needed for pain. 07/27/20  Yes [provider]  HYDROmorphone (DILAUDID) 2 MG tablet Take 2 mg by mouth See admin instructions. Given at 0500 on Harrell Lark & Saturday prior to Dialysis  treatment   Yes [provider]  latanoprost (XALATAN) 0.005 % ophthalmic solution Place 1 drop into both eyes at bedtime. 08/03/22  Yes [provider]  lovastatin (MEVACOR) 10 MG tablet Take 10 mg by mouth daily. 06/22/22  Yes [provider]  midodrine (PROAMATINE) 5 MG tablet Take 10 mg by mouth 3 (three) times a week. Harrell Lark, Saturday 06/22/22  Yes [provider]  Multiple Vitamins-Minerals (DECUBI-VITE) CAPS Take 1 capsule by mouth daily.   Yes [provider]  sertraline (ZOLOFT) 50 MG  tablet Take 50 mg by mouth daily. 06/22/22  Yes [provider]  SIMBRINZA 1-0.2 % SUSP Place 1 drop into both eyes 2 (two) times daily. 07/26/22  Yes [provider]  Soft Lens Products (REFRESH CONTACTS DROPS) SOLN Place 1 drop into both eyes in the morning, at noon, and at bedtime.   Yes [provider]  topiramate (TOPAMAX) 25 MG tablet Take 25 mg by mouth 2 (two) times daily. 06/22/22  Yes [provider]   Allergies  Allergen Reactions   Penicillins Anaphylaxis   Amlodipine    Aspirin     Throat swells   Gabapentin    Review of Systems  Unable to perform ROS: Mental status change    Physical Exam Vitals and nursing note reviewed.  Constitutional:      General: She is not in acute distress.    Appearance: She is cachectic. She is ill-appearing.  Pulmonary:     Effort: No respiratory distress.  Skin:    General: Skin is warm and dry.  Neurological:     Mental Status: She is alert. She is disoriented and confused.     Motor: Weakness present.  Psychiatric:        Attention and Perception: Attention normal.        Speech: Speech is slurred.        Cognition and Memory: Cognition is impaired. Memory is impaired.        Judgment: Judgment is impulsive.     Vital Signs: BP (!) 110/55   Pulse 69   Temp 97.9 F (36.6 C)   Resp 14   LMP  (LMP Unknown)   SpO2 97%  Pain Scale: 0-10   Pain Score: 9    SpO2: SpO2: 97 % O2 Device:SpO2: 97 % O2 Flow Rate: .   IO: Intake/output summary:  Intake/Output Summary (Last 24 hours) at 08/20/2022 1323 Last data filed at 08/20/2022 1042 Gross per 24 hour  Intake 652.28 ml  Output --  Net 652.28 ml    LBM:   Baseline Weight:   Most recent weight:       Palliative Assessment/Data: PPS 20%     Time In-Out: 1330-1400/1615-1715 Time Total: 90 minutes  Greater than 50%  of this time was spent counseling and coordinating care related to the above assessment and plan.  Signed by: Lin Landsman, NP   Please contact Palliative Medicine Team phone at [phone] for questions and concerns.  For individual provider: See Amion  *Portions of this note are a verbal dictation therefore any spelling and/or grammatical errors are due to the "[place] One" system interpretation.

## 2022-08-20 NOTE — Progress Notes (Signed)
OT Cancellation Note  Patient Details Name: Dorothy Cooper MRN: [id number] DOB: 10-25-1950   Cancelled Treatment:    Reason Eval/Treat Not Completed: Medical issues which prohibited therapy (Awaiting TLSO which has not yet arrived to patient's room. Will return as schedule allows.)  [place], OTR/L Acute Rehab Office: [phone] 08/20/2022, 10:24 AM

## 2022-08-21 ENCOUNTER — Encounter (HOSPITAL_COMMUNITY): Payer: Self-pay | Admitting: Internal Medicine

## 2022-08-21 ENCOUNTER — Inpatient Hospital Stay (HOSPITAL_COMMUNITY): Payer: Medicare Other

## 2022-08-21 DIAGNOSIS — I5032 Chronic diastolic (congestive) heart failure: Secondary | ICD-10-CM | POA: Diagnosis not present

## 2022-08-21 DIAGNOSIS — G9341 Metabolic encephalopathy: Secondary | ICD-10-CM | POA: Diagnosis not present

## 2022-08-21 DIAGNOSIS — L8990 Pressure ulcer of unspecified site, unspecified stage: Secondary | ICD-10-CM | POA: Insufficient documentation

## 2022-08-21 DIAGNOSIS — K7460 Unspecified cirrhosis of liver: Secondary | ICD-10-CM | POA: Diagnosis not present

## 2022-08-21 DIAGNOSIS — Z711 Person with feared health complaint in whom no diagnosis is made: Secondary | ICD-10-CM

## 2022-08-21 DIAGNOSIS — D61818 Other pancytopenia: Secondary | ICD-10-CM

## 2022-08-21 DIAGNOSIS — R4182 Altered mental status, unspecified: Secondary | ICD-10-CM | POA: Diagnosis not present

## 2022-08-21 LAB — HEPATITIS B SURFACE ANTIBODY, QUANTITATIVE: Hep B S AB Quant (Post): <3.1 m[IU]/mL — ABNORMAL LOW (ref >9.9)

## 2022-08-21 LAB — CBC WITH DIFFERENTIAL/PLATELET
Abs Immature Granulocytes: 0.05 10*3/uL (ref 0.00–0.07)
Basophils Absolute: 0 10*3/uL (ref 0.0–0.1)
Basophils Relative: 0 %
Eosinophils Absolute: 0 10*3/uL (ref 0.0–0.5)
Eosinophils Relative: 0 %
HCT: 24.1 % — ABNORMAL LOW (ref 36.0–46.0)
Hemoglobin: 8.2 g/dL — ABNORMAL LOW (ref 12.0–15.0)
Immature Granulocytes: 1 %
Lymphocytes Relative: 8 %
Lymphs Abs: 0.4 10*3/uL — ABNORMAL LOW (ref 0.7–4.0)
MCH: 34.6 pg — ABNORMAL HIGH (ref 26.0–34.0)
MCHC: 34 g/dL (ref 30.0–36.0)
MCV: 101.7 fL — ABNORMAL HIGH (ref 80.0–100.0)
Monocytes Absolute: 0.5 10*3/uL (ref 0.1–1.0)
Monocytes Relative: 10 %
Neutro Abs: 4.1 10*3/uL (ref 1.7–7.7)
Neutrophils Relative %: 81 %
Platelets: 17 10*3/uL — CL (ref 150–400)
RBC: 2.37 MIL/uL — ABNORMAL LOW (ref 3.87–5.11)
RDW: 16.6 % — ABNORMAL HIGH (ref 11.5–15.5)
WBC: 5.2 10*3/uL (ref 4.0–10.5)
nRBC: 0 % (ref 0.0–0.2)

## 2022-08-21 LAB — COMPREHENSIVE METABOLIC PANEL
ALT: 25 U/L (ref 0–44)
AST: 33 U/L (ref 15–41)
Albumin: 2 g/dL — ABNORMAL LOW (ref 3.5–5.0)
Alkaline Phosphatase: 70 U/L (ref 38–126)
Anion gap: 13 (ref 5–15)
BUN: 72 mg/dL — ABNORMAL HIGH (ref 8–23)
CO2: 25 mmol/L (ref 22–32)
Calcium: 9.9 mg/dL (ref 8.9–10.3)
Chloride: 92 mmol/L — ABNORMAL LOW (ref 98–111)
Creatinine, Ser: 6.18 mg/dL — ABNORMAL HIGH (ref 0.44–1.00)
GFR, Estimated: 7 mL/min — ABNORMAL LOW (ref >60)
Glucose, Bld: 160 mg/dL — ABNORMAL HIGH (ref 70–99)
Potassium: 3.4 mmol/L — ABNORMAL LOW (ref 3.5–5.1)
Sodium: 130 mmol/L — ABNORMAL LOW (ref 135–145)
Total Bilirubin: 2.1 mg/dL — ABNORMAL HIGH (ref 0.3–1.2)
Total Protein: 4.7 g/dL — ABNORMAL LOW (ref 6.5–8.1)

## 2022-08-21 LAB — MAGNESIUM: Magnesium: 2.9 mg/dL — ABNORMAL HIGH (ref 1.7–2.4)

## 2022-08-21 LAB — GLUCOSE, CAPILLARY
Glucose-Capillary: 153 mg/dL — ABNORMAL HIGH (ref 70–99)
Glucose-Capillary: 121 mg/dL — ABNORMAL HIGH (ref 70–99)
Glucose-Capillary: 97 mg/dL (ref 70–99)
Glucose-Capillary: 84 mg/dL (ref 70–99)
Glucose-Capillary: 66 mg/dL — ABNORMAL LOW (ref 70–99)
Glucose-Capillary: 181 mg/dL — ABNORMAL HIGH (ref 70–99)
Glucose-Capillary: 62 mg/dL — ABNORMAL LOW (ref 70–99)

## 2022-08-21 LAB — HEMOGLOBIN A1C
Hgb A1c MFr Bld: 4.7 % — ABNORMAL LOW (ref 4.8–5.6)
Mean Plasma Glucose: 88 mg/dL

## 2022-08-21 LAB — BRAIN NATRIURETIC PEPTIDE: B Natriuretic Peptide: 858.9 pg/mL — ABNORMAL HIGH (ref 0.0–100.0)

## 2022-08-21 LAB — AMMONIA: Ammonia: 50 umol/L — ABNORMAL HIGH (ref 9–35)

## 2022-08-21 LAB — TSH: TSH: 3.171 u[IU]/mL (ref 0.350–4.500)

## 2022-08-21 LAB — PROTIME-INR
INR: 3.6 — ABNORMAL HIGH (ref 0.8–1.2)
Prothrombin Time: 35.5 seconds — ABNORMAL HIGH (ref 11.4–15.2)

## 2022-08-21 LAB — HAPTOGLOBIN: Haptoglobin: 23 mg/dL — ABNORMAL LOW (ref 37–355)

## 2022-08-21 MED ORDER — LACTATED RINGERS IV BOLUS
200.0000 mL | Freq: Once | INTRAVENOUS | Status: AC
  Administered 2022-08-21: 200 mL via INTRAVENOUS

## 2022-08-21 MED ORDER — DEXTROSE 10 % IV SOLN: INTRAVENOUS | Status: DC

## 2022-08-21 MED ORDER — SODIUM CHLORIDE 0.9% IV SOLUTION
Freq: Once | INTRAVENOUS | Status: AC
Start: 2022-08-21 — End: 2022-08-21

## 2022-08-21 MED ORDER — LACTULOSE 10 GM/15ML PO SOLN
30.0000 g | Freq: Two times a day (BID) | ORAL | Status: AC
  Administered 2022-08-21: 30 g via ORAL
  Filled 2022-08-21: qty 45

## 2022-08-21 MED ORDER — LACTULOSE ENEMA
300.0000 mL | Freq: Once | RECTAL | Status: DC
Start: 2022-08-21 — End: 2022-08-21
  Filled 2022-08-21: qty 300

## 2022-08-21 MED ORDER — NALOXONE HCL 0.4 MG/ML IJ SOLN
0.1000 mg | Freq: Once | INTRAMUSCULAR | Status: AC
  Administered 2022-08-21: 0.1 mg via INTRAVENOUS
  Filled 2022-08-21: qty 1

## 2022-08-21 MED ORDER — HYDROMORPHONE HCL 1 MG/ML IJ SOLN
0.2500 mg | Freq: Once | INTRAMUSCULAR | Status: AC
  Administered 2022-08-21: 0.25 mg via INTRAVENOUS
  Filled 2022-08-21: qty 0.5

## 2022-08-21 MED ORDER — HEPARIN SODIUM (PORCINE) 1000 UNIT/ML DIALYSIS: 2000.0000 [IU] | Freq: Once | INTRAMUSCULAR | Status: DC

## 2022-08-21 MED ORDER — DEXTROSE 50 % IV SOLN
INTRAVENOUS | Status: AC
  Administered 2022-08-21: 50 mL
  Filled 2022-08-21: qty 50

## 2022-08-21 NOTE — Progress Notes (Signed)
Daily Progress Note   Patient Name: Dorothy Cooper       Date: 08/21/2022 DOB: [birth year]  Age: 72 y.o. MRN#: [id number] Attending Physician: Thurnell Lose, MD Primary Care Physician: Pcp, No Admit Date: 08/19/2022  Reason for Consultation/Follow-up: Establishing goals of care  Subjective: Chart review performed. Received report from primary RN -no acute concerns. RN reports patient has been lethargic. Narcan given this morning. Neuro recommendations noted - reviewed repeat head CT results.   Went to visit patient at bedside - no family/visitors present. Patient lying in bed asleep - she does wake to voice/gentle touch; however, minimally responsive. She tells me she "feels bad" but is unable to elaborate further. She requests water - she was able to take several small sips. Not fully accepting of medications in applesauce as primary RN was trying to provide during my visit. Signs and non-verbal gestures of pain/discomfort noted. No respiratory distress, increased work of breathing, or secretions noted.   Attempted to call granddaughter/[name] - unfortunately was disconnected early in conversation. Return calls went to voicemail - left message to return call.  12:14 PM Notified [name] returned call and is on her way to hospital.  3:30 PM Martin Majestic to patient's bedside - primary RN, granddaughter/[name], daughter/[name], daughter/[name] present. Patient was lying in bed asleep - I did not attempt to wake her. Emotional support provided to family.   Primary RN states patient has not eaten today.   Family are hoping patient can go for dialysis soon today. They do not want to make any final decisions regarding comfort/hospice care until they see how she is after dialysis. Primary RN called HD unit to  confirm patient will be dialyzed today - they did confirm. Per family and primary RN, patient is now with vaginal bleeding. Per family, this has been intermittent for at least two months; however, it is "worse now." Reviewed medications in detail per their request, including last administration times.  Therapeutic listening and emotional support provided as family reflect on what they consider sub-optimal care the last several months at facility. Family are not sure if patient actually received dialysis Saturday; which is why they don't want to make any decisions until after her treatment today. They are still processing how quickly patient has declined.   Discussed vaginal bleeding and administration of narcan in light of  patient's ongoing medical issues. [name] plans on staying the night. Other family are planning on coming to hospital to visit and "say their goodbyes."  Primary RN paged chaplain for support per family request.  All questions and concerns addressed. Encouraged to call with questions and/or concerns. PMT card previously provided.  Length of Stay: 1  Current Medications: Scheduled Meds:   Chlorhexidine Gluconate Cloth  6 each Topical Q0600   cholecalciferol  1,000 Units Oral Daily   [START ON 08/22/2022] heparin  2,000 Units Dialysis Once in dialysis   insulin aspart  0-6 Units Subcutaneous Q4H   lactulose  300 mL Rectal Once   midodrine  10 mg Oral TID WC   pantoprazole  40 mg Oral Daily   pravastatin  20 mg Oral q1800   sertraline  50 mg Oral Daily    Continuous Infusions:  cefTRIAXone (ROCEPHIN)  IV 1 g (08/20/22 2159)   methocarbamol (ROBAXIN) IV      PRN Meds: acetaminophen **OR** acetaminophen, HYDROmorphone, methocarbamol (ROBAXIN) IV, ondansetron (ZOFRAN) IV, traMADol  Physical Exam Vitals and nursing note reviewed.  Constitutional:      General: She is not in acute distress.    Appearance: She is ill-appearing.  Pulmonary:     Effort: No respiratory  distress.  Skin:    General: Skin is warm and dry.  Neurological:     Mental Status: She is lethargic, disoriented and confused.     Motor: Weakness present.  Psychiatric:        Speech: Speech is slurred.        Cognition and Memory: Cognition is impaired. Memory is impaired.             Vital Signs: BP (!) 105/53   Pulse 75   Temp 98 F (36.7 C) (Oral)   Resp 19   Ht '5\' 2"'$  (1.575 m)   Wt 58.2 kg   LMP  (LMP Unknown)   SpO2 99%   BMI 23.47 kg/m  SpO2: SpO2: 99 % O2 Device: O2 Device: Nasal Cannula O2 Flow Rate: O2 Flow Rate (L/min): 2 L/min  Intake/output summary:  Intake/Output Summary (Last 24 hours) at 08/21/2022 1048 Last data filed at 08/21/2022 0827 Gross per 24 hour  Intake 1124.81 ml  Output 0 ml  Net 1124.81 ml   LBM: Last BM Date :  (UTA) Baseline Weight: Weight: 58.2 kg Most recent weight: Weight: 58.2 kg       Palliative Assessment/Data: PPS 10%      Patient Active Problem List   Diagnosis Date Noted   Pressure injury of skin 08/21/2022   Other pancytopenia (HCC)    Acute metabolic encephalopathy 54/08/8118   UTI (urinary tract infection) 08/19/2022   Cirrhosis ([place]) 08/19/2022   DM2 (diabetes mellitus, type 2) (HCC) 08/19/2022   Paroxysmal atrial fibrillation (HCC) 08/19/2022   Elevated INR 08/19/2022   Chronic diastolic CHF (congestive heart failure) (HCC) 08/19/2022   Hypotension 08/19/2022   ESRD (end stage renal disease) ([place]) 08/19/2022   Thrombocytopenia ([place]) 08/19/2022   Compression fracture of lumbar vertebra ([place]) 08/19/2022    Palliative Care Assessment & Plan   Patient Profile: 72 y.o. female  with past medical history of end-stage renal disease on hemodialysis Tuesday/Thursday/ Saturday, thrombocytopenia, cirrhosis  unclear reason, diastolic CHF, chronic anemia, asthma, COPD, DM2, glaucoma, HLD, HTN pancytopenia, on eliquis for A.fib, migraine headaches presented to ED on 08/19/22 from [facility] with AMS. Patient was admitted  on 08/19/2022 with acute metabolic encephalopathy, UTI, cirrhosis, hypotension,  thrombocytopenia, cirrhosis, L and T-spine compression fracture. Neurosurgery states patient is not a surgical candidate; only option is TLSO brace.   Assessment: Principal Problem:   Acute metabolic encephalopathy Active Problems:   UTI (urinary tract infection)   Cirrhosis (HCC)   DM2 (diabetes mellitus, type 2) (HCC)   Paroxysmal atrial fibrillation (HCC)   Elevated INR   Chronic diastolic CHF (congestive heart failure) (HCC)   Hypotension   ESRD (end stage renal disease) (HCC)   Thrombocytopenia (HCC)   Compression fracture of lumbar vertebra (HCC)   Pressure injury of skin   Other pancytopenia ([place])   Concern about end of life  Recommendations/Plan: Continue current medical treatment with watchful waiting Continue DNR/DNI as previously documented - durable DNR form completed and placed in shadow chart. Copy was made and will be scanned into Vynca/ACP tab Family do not want to make final decisions on comfort/hospice until they see how she is after dialysis today If no significant improvement by tomorrow 8/23, they will likely be ready for patient's transition to full comfort/hospice Ongoing GOC pending clinical course PMT will continue to follow and support holistically   Goals of Care and Additional Recommendations: Limitations on Scope of Treatment: Full Scope Treatment and No Tracheostomy  Code Status:    Code Status Orders  (From admission, onward)           Start     Ordered   08/20/22 1724  Do not attempt resuscitation (DNR)  Continuous       Question Answer Comment  In the event of cardiac or respiratory ARREST Do not call a "code blue"   In the event of cardiac or respiratory ARREST Do not perform Intubation, CPR, defibrillation or ACLS   In the event of cardiac or respiratory ARREST Use medication by any route, position, wound care, and other measures to relive pain and  suffering. May use oxygen, suction and manual treatment of airway obstruction as needed for comfort.      08/20/22 1723           Code Status History     Date Active Date Inactive Code Status Order ID Comments User Context   08/19/2022 2236 08/20/2022 1723 Full Code [id number]  Toy Baker, MD ED       Prognosis:  < 2 weeks  Discharge Planning: To Be Determined  Care plan was discussed with primary RN, patient's family, Dr. Candiss Norse, [facility]  Thank you for allowing the Palliative Medicine Team to assist in the care of this patient.   Total Time 55 minutes Prolonged Time Billed  no       Greater than 50%  of this time was spent counseling and coordinating care related to the above assessment and plan.  Lin Landsman, NP  Please contact Palliative Medicine Team phone at [phone] for questions and concerns.   *Portions of this note are a verbal dictation therefore any spelling and/or grammatical errors are due to the "[place] One" system interpretation.

## 2022-08-21 NOTE — Procedures (Signed)
Patient Name: Dorothy Cooper  MRN: [id number]  Epilepsy Attending: Lora Havens  Referring Physician/Provider: Thurnell Lose, MD  Date: 08/21/2022 Duration: 26.25 mins  Patient history: 72 year old female with altered mental status.  EEG to evaluate for seizure.  Level of alertness:  lethargic   AEDs during EEG study: None  Technical aspects: This EEG study was done with scalp electrodes positioned according to the 10-20 International system of electrode placement. Electrical activity was reviewed with band pass filter of 1-'70Hz'$ , sensitivity of 7 uV/mm, display speed of 31m/sec with a '60Hz'$  notched filter applied as appropriate. EEG data were recorded continuously and digitally stored.  Video monitoring was available and reviewed as appropriate.  Description: No clear posterior dominant rhythm was seen.  EEG showed continuous generalized 2-3 hz delta slowing admixed with intermittent triphasic waves.  Hyperventilation and photic stimulation were not performed.     ABNORMALITY - Continuous slow, generalized -Triphasic waves, generalized  IMPRESSION: This study is suggestive of severe diffuse encephalopathy, nonspecific to etiology but most likely related to toxic-metabolic causes.  No seizures or definite epileptiform discharges were seen throughout the recording.  [name] [name]

## 2022-08-21 NOTE — Consult Note (Addendum)
Neurology Consultation  Reason for Consult: Altered mental status Referring Physician: Dr. Candiss Norse  CC: Altered mental status  History is obtained from: Chart  HPI: Dorothy Cooper is a 72 y.o. female past medical history of CHF, ESRD on dialysis, diabetes, hypertension, thrombocytopenia, atrial fibrillation-on anticoagulation at the outside facility with supratherapeutic INR with reversal done on this admission, NASH, brought in from skilled nursing facility for altered mental status, decreased level of consciousness and not following commands.  She was brought into the ER at [facility], noted to have soft blood pressures with systolic in the 66Q, and a possible UTI on the labs for which she was admitted.  Nephrology is following for the ESRD.  Heme-onc was consulted for thrombocytopenia-question ITP versus due to underlying liver disease. Also noted to have multiple subacute or chronic thoracic and lumbar spine fractures-no surgical intervention planned. Patient remains encephalopathic for which neurological consultation was obtained. According to the primary hospitalist, there was some improvement in her mentation with administration of Narcan because she is on chronic pain medications/opiates and had been receiving them consistently.  There was some improvement in her mentation-was able to be verbal but not following commands still. Patient is unable to provide any reliable history.  ROS: Unable to obtain due to altered mental status.   Past Medical History:  Diagnosis Date   Anemia    CHF (congestive heart failure) (HCC)    Chronic kidney disease    Diabetes mellitus without complication (HCC)    Dysrhythmia    Hypertension      Family History  Problem Relation Age of Onset   CAD Other    Hypertension Other      Social History:   reports that she has quit smoking. Her smoking use included cigarettes. She has never used smokeless tobacco. She reports that she does not  currently use alcohol. She reports that she does not use drugs.  Medications  Current Facility-Administered Medications:    acetaminophen (TYLENOL) tablet 650 mg, 650 mg, Oral, Q6H PRN **OR** acetaminophen (TYLENOL) suppository 650 mg, 650 mg, Rectal, Q6H PRN, Doutova, Anastassia, MD   cefTRIAXone (ROCEPHIN) 1 g in sodium chloride 0.9 % 100 mL IVPB, 1 g, Intravenous, Q24H, Doutova, Anastassia, MD, Last Rate: 200 mL/hr at 08/20/22 2159, 1 g at 08/20/22 2159   Chlorhexidine Gluconate Cloth 2 % PADS 6 each, 6 each, Topical, Q0600, Roney Jaffe, MD, 6 each at 08/21/22 0447   cholecalciferol (VITAMIN D3) 25 MCG (1000 UNIT) tablet 1,000 Units, 1,000 Units, Oral, Daily, Thurnell Lose, MD, 1,000 Units at 08/20/22 1535   HYDROmorphone (DILAUDID) tablet 1 mg, 1 mg, Oral, Q6H PRN, Thurnell Lose, MD, 1 mg at 08/20/22 1814   insulin aspart (novoLOG) injection 0-6 Units, 0-6 Units, Subcutaneous, Q4H, Doutova, Anastassia, MD, 1 Units at 08/21/22 0446   methocarbamol (ROBAXIN) 500 mg in dextrose 5 % 50 mL IVPB, 500 mg, Intravenous, Q6H PRN, Doutova, Anastassia, MD   midodrine (PROAMATINE) tablet 10 mg, 10 mg, Oral, TID WC, Doutova, Anastassia, MD, 10 mg at 08/20/22 1812   ondansetron (ZOFRAN) injection 4 mg, 4 mg, Intravenous, Q6H PRN, Kristopher Oppenheim, DO, 4 mg at 08/20/22 0230   pantoprazole (PROTONIX) EC tablet 40 mg, 40 mg, Oral, Daily, Lala Lund K, MD, 40 mg at 08/20/22 0924   pravastatin (PRAVACHOL) tablet 20 mg, 20 mg, Oral, q1800, Thurnell Lose, MD, 20 mg at 08/20/22 1813   sertraline (ZOLOFT) tablet 50 mg, 50 mg, Oral, Daily, Thurnell Lose, MD,  50 mg at 08/20/22 1535   traMADol (ULTRAM) tablet 50 mg, 50 mg, Oral, Q6H PRN, Thurnell Lose, MD, 50 mg at 08/20/22 1751   Exam: Current vital signs: BP 112/64   Pulse 91   Temp 98 F (36.7 C) (Oral)   Resp 15   Ht '5\' 2"'$  (1.575 m)   Wt 58.2 kg   LMP  (LMP Unknown)   SpO2 93%   BMI 23.47 kg/m  Vital signs in last 24  hours: Temp:  [97.4 F (36.3 C)-98 F (36.7 C)] 98 F (36.7 C) (08/22 0827) Pulse Rate:  [65-91] 91 (08/22 0827) Resp:  [10-19] 15 (08/22 0827) BP: (92-122)/(44-75) 112/64 (08/22 0827) SpO2:  [92 %-100 %] 93 % (08/22 0827) Weight:  [58.2 kg] 58.2 kg (08/21 2142) General: Drowsy, mumbles to verbal and noxious stimulation but does not open eyes and resists eye opening actively. HEENT: Normocephalic atraumatic CVS: Regular rhythm Musculoskeletal/-both lower extremities with trace edema.  Has TLSO brace on. Respiratory: Breathing well saturating normally on room air Abdomen nondistended nontender Neurological exam Drowsy, mumbles to noxious stimulation and voice but does not open eyes and resists eye opening actively Does not follow commands The only question she answered to was if she had pain to which she consistently said yes.  Upon asking where she had pain, she mumbled incomprehensible words. Cranial nerves: Actively resists eye opening so difficult exam.  Pupils are equal round and reactive.  Difficult to assess for feels and extraocular movement.  Face appears symmetric. Motor examination: Does not follow commands to raise arms or legs against gravity but to noxious stimulation, strong withdrawal in the lower extremities bilaterally and also seems to have decent upper extremity strength as evidenced by resisting passive lifting of her arms for examination. Sensory exam: As above coordination cannot be assessed due to mentation  Labs I have reviewed labs in epic and the results pertinent to this consultation are:  CBC    Component Value Date/Time   WBC 5.2 08/21/2022 0313   RBC 2.37 (L) 08/21/2022 0313   HGB 8.2 (L) 08/21/2022 0313   HCT 24.1 (L) 08/21/2022 0313   PLT 17 (LL) 08/21/2022 0313   MCV 101.7 (H) 08/21/2022 0313   MCH 34.6 (H) 08/21/2022 0313   MCHC 34.0 08/21/2022 0313   RDW 16.6 (H) 08/21/2022 0313   LYMPHSABS 0.4 (L) 08/21/2022 0313   MONOABS 0.5 08/21/2022  0313   EOSABS 0.0 08/21/2022 0313   BASOSABS 0.0 08/21/2022 0313    CMP     Component Value Date/Time   NA 130 (L) 08/21/2022 0313   K 3.4 (L) 08/21/2022 0313   CL 92 (L) 08/21/2022 0313   CO2 25 08/21/2022 0313   GLUCOSE 160 (H) 08/21/2022 0313   BUN 72 (H) 08/21/2022 0313   CREATININE 6.18 (H) 08/21/2022 0313   CALCIUM 9.9 08/21/2022 0313   PROT 4.7 (L) 08/21/2022 0313   ALBUMIN 2.0 (L) 08/21/2022 0313   AST 33 08/21/2022 0313   ALT 25 08/21/2022 0313   ALKPHOS 70 08/21/2022 0313   BILITOT 2.1 (H) 08/21/2022 0313   GFRNONAA 7 (L) 08/21/2022 0313   Urinalysis in the ER-with suggestion of UTI  Ammonia level 08/20/2019 23-27. B12>2000  Imaging I have reviewed the images obtained:  CT-head: No acute changes Chest abdomen pelvis: Hepatic cirrhosis, splenomegaly, cholelithiasis.  Multiple thoracic and lumbar compression deformities.  Marked compression deformity of L2 and 3 with buckling of posterior cortex.  Moderate L5 compression fracture.  There  is fracture through the superior aspect of L1 vertebral body that is age-indeterminate.  Mild T6 compression fracture.  Assessment:  72 year old woman with a past history of CHF, ESRD on dialysis, diabetes, hypertension, thrombocytopenia, atrial fibrillation currently not on anticoagulation with supratherapeutic INR, NASH, brought in for evaluation of altered mental status, depressed level of consciousness. She is on chronic pain medications and opiates.  Noted to have hypotension and possible UTI in the ER. Somewhat improved with antibiotics and Narcan administration but still remains encephalopathic. Also noted to have multiple thoracolumbar spine compression fractures which are being managed conservatively. At this time, remains extremely encephalopathic-likely explanation of her mental status is toxic metabolic encephalopathy in the setting of systemic illness including UTI. Given history of atrial fibrillation, stroke needs to be  ruled out.  Recommendations: Minimize sedating medications as you are MRI would be ideal as strokes need to be ruled out given history of atrial fibrillation but she is unable to lay flat and straight in bed.  I would recommend repeating a head CT. EEG TSH Management of toxic metabolic derangements per primary team as you are Plan d/w Dr. Candiss Norse   -- Amie Portland, MD Neurologist [facility] Pager: [phone]

## 2022-08-21 NOTE — TOC Initial Note (Signed)
Transition of Care [facility]) - Initial/Assessment Note    Patient Details  Name: Dorothy Cooper MRN: [id number] Date of Birth: 20-Aug-1950  [facility]) CM/SW Contact:    Coralee Pesa, [place] Phone Number: 08/21/2022, 12:45 PM  Clinical Narrative:                 CSW spoke with Crystal at [facility] who confirmed pt is a LTC resident at their facility and has been since April, they have no barriers to pt returning. CSW spoke with [name], pt's granddaughter, who noted that pt was placed at [facility], but it was not the plan. They are currently working on LTC placement at a separate facility, She will provide contact information so CSW can follow up. CSW was also advised that depending on the pt's improvement, they may need to consider hospice at [facility]. CSW will continue to follow for disposition.  Expected Discharge Plan: Skilled Nursing Facility Barriers to Discharge: Continued Medical Work up   Patient Goals and CMS Choice Patient states their goals for this hospitalization and ongoing recovery are:: Pt unable to participate in goal setting at this time. CMS Medicare.gov Compare Post Acute Care list provided to:: Patient Represenative (must comment) (Daughter) Choice offered to / list presented to : Adult Children  Expected Discharge Plan and Services Expected Discharge Plan: [place] Acute Care Choice: [place] arrangements for the past 2 months: [place]                                      Prior Living Arrangements/Services Living arrangements for the past 2 months: [place] Lives with:: Facility Resident Patient language and need for interpreter reviewed:: Yes Do you feel safe going back to the place where you live?: Yes      Need for Family Participation in Patient Care: Yes (Comment) Care giver support system in place?: Yes (comment)   Criminal Activity/Legal Involvement  Pertinent to Current Situation/Hospitalization: No - Comment as needed  Activities of Daily Living Home Assistive Devices/Equipment: None ADL Screening (condition at time of admission) Patient's cognitive ability adequate to safely complete daily activities?: Yes Is the patient deaf or have difficulty hearing?: No Does the patient have difficulty seeing, even when wearing glasses/contacts?: Yes Does the patient have difficulty concentrating, remembering, or making decisions?: Yes Patient able to express need for assistance with ADLs?: No Does the patient have difficulty dressing or bathing?: Yes Independently performs ADLs?: No Communication: Needs assistance Is this a change from baseline?: Change from baseline, expected to last >3 days (UTA) Dressing (OT): Dependent Is this a change from baseline?: Change from baseline, expected to last >3 days (UTA) Grooming: Dependent Is this a change from baseline?: Change from baseline, expected to last >3 days (UTA) Feeding: Dependent Is this a change from baseline?: Change from baseline, expected to last >3 days (UTA) Bathing: Dependent Is this a change from baseline?: Change from baseline, expected to last >3 days (UTA) Toileting: Dependent Is this a change from baseline?: Change from baseline, expected to last >3days (UTA) In/Out Bed: Dependent Is this a change from baseline?: Change from baseline, expected to last >3 days (UTA) Walks in Home: Dependent ([place]) Is this a change from baseline?: Change from baseline, expected to last >3 days (UTA) Does the patient have difficulty walking or climbing stairs?: Yes Weakness of  Legs: Both Weakness of Arms/Hands: Both  Permission Sought/Granted Permission sought to share information with : Family Supports Permission granted to share information with : Yes, Verbal Permission Granted  Share Information with NAME: Kerrin Mo     Permission granted to share info w Relationship:  Daughter  Permission granted to share info w Contact Information: [phone]  Emotional Assessment Appearance:: Appears stated age Attitude/Demeanor/Rapport: Unable to Assess Affect (typically observed): Unable to Assess Orientation: :  (Disoriented X4) Alcohol / Substance Use: Not Applicable Psych Involvement: No (comment)  Admission diagnosis:  Altered mental status, unspecified altered mental status type [R48.54] Acute metabolic encephalopathy [O27.03] Patient Active Problem List   Diagnosis Date Noted   Pressure injury of skin 08/21/2022   Other pancytopenia ([place])    Acute metabolic encephalopathy 50/08/3817   UTI (urinary tract infection) 08/19/2022   Cirrhosis ([place]) 08/19/2022   DM2 (diabetes mellitus, type 2) ([place]) 08/19/2022   Paroxysmal atrial fibrillation ([place]) 08/19/2022   Elevated INR 08/19/2022   Chronic diastolic CHF (congestive heart failure) ([place]) 08/19/2022   Hypotension 08/19/2022   ESRD (end stage renal disease) ([place]) 08/19/2022   Thrombocytopenia ([place]) 08/19/2022   Compression fracture of lumbar vertebra ([place]) 08/19/2022   PCP:  Pcp, No Pharmacy:  No Pharmacies Listed    Social Determinants of Health (SDOH) Interventions    Readmission Risk Interventions     No data to display

## 2022-08-21 NOTE — Progress Notes (Signed)
  X-cover Note: Hypoglycemia, persistent despite oral OJ. Will start D10W infusion.   Kristopher Oppenheim, DO [facility]

## 2022-08-21 NOTE — Progress Notes (Signed)
Orthopedic Tech Progress Note Patient Details:  Dorothy Cooper Jul 30, 1950 [id number]  Ortho Devices Type of Ortho Device: Thoracolumbar corset (TLSO) Ortho Device/Splint Interventions: Ordered, Application, Adjustment   Post Interventions Patient Tolerated: Well Instructions Provided: Care of device, Adjustment of device  Karolee Stamps 08/21/2022, 6:42 AM

## 2022-08-21 NOTE — Progress Notes (Signed)
   08/21/22 1545  Clinical Encounter Type  Visited With Patient and family together  Visit Type Initial;Spiritual support  Referral From Nurse  Consult/Referral To [facility] responded to page to support family of patient who may be going on comfort care. Chaplain was able to talk patient's granddaughter, daughter, and sister-in-law. Family requested prayer which was provided and asked to follow-up with them on 08/22/2022.  Melody Haver, Resident Chaplain [phone]

## 2022-08-21 NOTE — Progress Notes (Signed)
Platelets 17K, will transfuse 1 unit, POA [name] called, consent given to transfuse.

## 2022-08-21 NOTE — Progress Notes (Addendum)
PROGRESS NOTE                                                                                                                                                                                                             Patient Demographics:    Dorothy Cooper, is a 72 y.o. female, DOB - 10/24/1950, DIY:[id number]  Outpatient Primary MD for the patient is Pcp, No    LOS - 1  Admit date - 08/19/2022    Chief Complaint  Patient presents with   Altered Mental Status       Brief Narrative (HPI from H&P)   72 y.o. 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 In the ER she was found to have toxic and metabolic encephalopathy, acute on chronic back and abdominal pain, UTI, acute on chronic thrombocytopenia with elevated INR.   Subjective:   Patient in bed, somnolent but in no discomfort, wearing TLSO brace, cannot answer questions or follow commands reliably   Assessment  & Plan :   Acute metabolic and toxic encephalopathy.  Combination of UTI, acute on chronic pain and being on narcotics - CT head nonacute, initial ammonia and ABG stable, she had no focal deficits, has acute on chronic back pain with age indeterminant T and L-spine fractures, UA suggestive of UTI.  Overall she has a nontoxic appearance, she has been placed on appropriate IV antibiotics, will minimize narcotics as much as possible, since mentation did not improve much on second day, getting EEG, repeat head CT, repeat ammonia borderline elevated  hence lactulose enema x1.  Will obtain neurology input as well.  Continue supportive care.  T and L-spine compression fractures.  Age-indeterminate.  CT reviewed with neurosurgeon Dr. Trenton Gammon, supportive care with TLSO brace when she is out of bed. PT-OT.  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  We will give her some vitamin K IV, currently no signs of bleeding.  Monitor closely.  LDH normal with no schistocytes on peripheral smear. She is been following with hematologist at [facility] will request our hematology team to evaluate her 1 time.  Since platelet count dropped further on 08/21/2022 giving 2 units of platelet transfusion today, POA [name] consented.  Outpatient bone marrow biopsy was being considered will defer that to hematology.  ESRD.  On TTS schedule.  Nephrology has been called.  Paroxysmal atrial fibrillation.  Mali vas 2 score of greater than 3.  Currently blood pressure is too low, she is on low-dose Coreg, Coreg will be held, she was supposed to be off of Eliquis for her granddaughter, Eliquis will be discontinued upon discharge.  Hypotension.  Likely due to combination of UTI and volume depletion, hold Coreg, midodrine, gentle IV fluids x1 for a total of 500 cc as she is ESRD.  Chr pain - on high dose dilaudid at baseline, minimize, Pall Care for [place].  COPD/asthma.  At baseline no wheezing.  Supportive care.  Dyslipidemia.  Home med rec still not done.  Will reevaluate after med rec.  Chronic intermittent Vaginal bleed - supportive care for now.  DM type II.  Check A1c, low-dose sliding scale.  No results found for: "HGBA1C"  CBG (last 3)  Recent Labs    08/20/22 2154 08/21/22 0443 08/21/22 0810  GLUCAP 162*  153* 121*         Condition - Extremely Guarded  Family Communication  :    Darryl Nestle [phone]  08/20/22, 08/21/2022 early morning  Code Status :  Full  Consults  :  Renal, Haem, neuro  PUD Prophylaxis :     Procedures  :     CT Head - Non acute  CT Chest - 1. Hepatic cirrhosis. Splenomegaly and left upper quadrant collaterals and splenorenal shunting. Minimal perihepatic ascites. 2. Cholelithiasis without definite pericholecystic inflammation. 3. Dependent atelectasis in both lungs.  No pneumonia. 4. Cardiomegaly.  Coronary artery calcifications. 5. Multiple thoracic and lumbar compression deformities. Marked compression deformity of L2 and L3 with buckling of the posterior cortex. Moderate L5 compression fracture. There is a fracture through the superior aspect of L1 vertebral body that is age indeterminate. Mild T6 compression fracture.      Disposition Plan  :    Status is: Observation  DVT Prophylaxis  :    SCDs Start: 08/19/22 2237   Lab Results  Component Value Date   PLT 17 (LL) 08/21/2022    Diet :  Diet Order             DIET SOFT Room service appropriate? Yes; Fluid consistency: Thin  Diet effective now                    Inpatient Medications  Scheduled Meds:  Chlorhexidine Gluconate Cloth  6 each Topical Q0600   cholecalciferol  1,000 Units Oral Daily   [START ON 08/22/2022] heparin  2,000 Units Dialysis Once in dialysis   insulin aspart  0-6 Units Subcutaneous Q4H   lactulose  300 mL Rectal Once   midodrine  10 mg Oral TID WC   pantoprazole  40 mg Oral Daily   pravastatin  20 mg Oral q1800   sertraline  50 mg Oral Daily   Continuous Infusions:  cefTRIAXone (ROCEPHIN)  IV 1 g (08/20/22 2159)  methocarbamol (ROBAXIN) IV     PRN Meds:.acetaminophen **OR** acetaminophen, HYDROmorphone, methocarbamol (ROBAXIN) IV, ondansetron (ZOFRAN) IV, traMADol  Time Spent in minutes  30   Lala Lund M.D on 08/21/2022 at 11:04  AM  To page go to [email or web address]   [facility] -  Office  [phone]  See all Orders from today for further details    Objective:   Vitals:   08/21/22 0643 08/21/22 0800 08/21/22 0827 08/21/22 0900  BP: (!) 92/44 (!) 98/48 112/64 (!) 105/53  Pulse: 68 70 91 75  Resp: _0 Temp: 98 F (36.7 C) 97.7 F (36.5 C) 98 F (36.7 C)   TempSrc: Oral Oral Oral   SpO2: 98% 100% 93% 99%  Weight:      Height:        Wt Readings from Last 3 Encounters:  08/20/22 58.2 kg  05/01/22 72.6 kg     Intake/Output Summary (Last 24 hours) at 08/21/2022 1104 Last data filed at 08/21/2022 0827 Gross per 24 hour  Intake 1124.81 ml  Output 0 ml  Net 1124.81 ml     Physical Exam  Somnolent, moves all 4 extremities to painful stimuli, wearing TLSO brace [place].AT,PERRAL Supple Neck, No JVD,   Symmetrical Chest wall movement, Good air movement bilaterally, CTAB RRR,No Gallops, Rubs or new Murmurs,  +ve B.Sounds, Abd Soft, No tenderness,   No Cyanosis, Clubbing or edema        Data Review:    CBC Recent Labs  Lab 08/19/22 1515 08/19/22 1855 08/20/22 0500 08/21/22 0313  WBC 4.7  --  4.2  4.2 5.2  HGB 9.1*  --  7.9*  7.7* 8.2*  HCT 27.4*  --  24.0*  23.9* 24.1*  PLT 26* 21* 22*  22* 17*  MCV 106.6*  --  107.1*  105.8* 101.7*  MCH 35.4*  --  35.3*  34.1* 34.6*  MCHC 33.2  --  32.9  32.2 34.0  RDW 17.0*  --  17.0*  16.9* 16.6*  LYMPHSABS 0.3*  --  0.2* 0.4*  MONOABS 0.3  --  0.4 0.5  EOSABS 0.0  --  0.0 0.0  BASOSABS 0.0  --  0.0 0.0    Electrolytes Recent Labs  Lab 08/19/22 1515 08/19/22 1812 08/19/22 1855 08/19/22 2358 08/20/22 0500 08/21/22 0313 08/21/22 0809  NA 132*  --   --   --  131* 130*  --   K 4.1  --   --   --  3.9 3.4*  --   CL 92*  --   --   --  93* 92*  --   CO2 28  --   --   --  28 25  --   GLUCOSE 179*  --   --   --  125* 160*  --   BUN 52*  --   --   --  60* 72*  --   CREATININE 5.35*  --   --   --  5.56* 6.18*  --   CALCIUM  10.1  --   --   --  9.7 9.9  --   AST 60*  --   --   --  65* 33  --   ALT 33  --   --   --  28 25  --   ALKPHOS 75  --   --   --  62 70  --   BILITOT 2.2*  --   --   --  1.9* 2.1*  --   ALBUMIN 2.1*  --   --   --  2.2* 2.0*  --   MG 2.9*  --   --   --  2.9* 2.9*  --   CRP  --   --   --   --  18.0*  --   --   DDIMER  --   --  3.19*  --   --   --   --   LATICACIDVEN 2.1* 2.6*  --  2.0*  --   --   --   INR  --   --  7.4*  --  6.7* 3.6*  --   TSH  --   --   --   --   --  3.171  --   AMMONIA  --  27  --   --   --   --  50*  BNP  --   --   --   --  705.0* 858.9*  --    ID Labs Recent Labs  Lab 08/19/22 1515 08/19/22 1812 08/19/22 1855 08/19/22 2358 08/20/22 0500 08/21/22 0313  WBC 4.7  --   --   --  4.2  4.2 5.2  PLT 26*  --  21*  --  22*  22* 17*  CRP  --   --   --   --  18.0*  --   DDIMER  --   --  3.19*  --   --   --   LATICACIDVEN 2.1* 2.6*  --  2.0*  --   --   CREATININE 5.35*  --   --   --  5.56* 6.18*    Micro Results Recent Results (from the past 240 hour(s))  Urine Culture     Status: Abnormal (Preliminary result)   Collection Time: 08/19/22  9:42 PM   Specimen: In/Out Cath Urine  Result Value Ref Range Status   Specimen Description IN/OUT CATH URINE  Final   Special Requests   Final    NONE Performed at [facility], [place] [address], [place], [place] [address]    Culture >=100,000 COLONIES/mL GRAM NEGATIVE RODS (A)  Final   Report Status PENDING  Incomplete    Radiology Reports CT HEAD WO CONTRAST (5MM)  Result Date: 08/21/2022 CLINICAL DATA:  [place] EXAM: CT HEAD WITHOUT CONTRAST TECHNIQUE: Contiguous axial images were obtained from the base of the skull through the vertex without intravenous contrast. RADIATION DOSE REDUCTION: This exam was performed according to the departmental dose-optimization program which includes automated exposure control, adjustment of the mA and/or kV according to patient size and/or use of iterative reconstruction technique.  COMPARISON:  CT head 08/19/2022. FINDINGS: Motion limited. Brain: No evidence of acute infarction, hemorrhage, hydrocephalus, extra-axial collection or mass lesion/mass effect. Patchy white matter hypoattenuation, nonspecific but compatible with chronic microvascular disease. Similar cerebral atrophy. Vascular: Calcific intracranial atherosclerosis. Skull: No acute fracture. Sinuses/Orbits: Maxillary sinus retention cyst versus polyp with areas of mineralization. No acute orbital findings. Other: No mastoid effusions. IMPRESSION: Motion limited study without evidence of acute intracranial abnormality. Electronically Signed   By: Margaretha Sheffield M.D.   On: 08/21/2022 10:20   DG Chest Port 1 View  Result Date: 08/20/2022 CLINICAL DATA:  72 year old female with history of altered mental status. EXAM: PORTABLE CHEST 1 VIEW COMPARISON:  Chest x-ray 08/19/2022. FINDINGS: Lung volumes are low. No consolidative airspace disease. There is cephalization of the pulmonary vasculature and slight indistinctness of the interstitial  markings suggestive of mild pulmonary edema. Trace right pleural effusion. No definite left pleural effusion. Mild cardiomegaly. The patient is rotated to the right on today's exam, resulting in distortion of the mediastinal contours and reduced diagnostic sensitivity and specificity for mediastinal pathology. Atherosclerotic calcifications in the thoracic aorta. IMPRESSION: 1. The appearance the chest suggests mild congestive heart failure, as above. 2. Aortic atherosclerosis. Electronically Signed   By: Vinnie Langton M.D.   On: 08/20/2022 06:54   CT CHEST ABDOMEN PELVIS W CONTRAST  Result Date: 08/19/2022 CLINICAL DATA:  Sepsis. EXAM: CT CHEST, ABDOMEN, AND PELVIS WITH CONTRAST TECHNIQUE: Multidetector CT imaging of the chest, abdomen and pelvis was performed following the standard protocol during bolus administration of intravenous contrast. RADIATION DOSE REDUCTION: This exam was  performed according to the departmental dose-optimization program which includes automated exposure control, adjustment of the mA and/or kV according to patient size and/or use of iterative reconstruction technique. CONTRAST:  17m OMNIPAQUE IOHEXOL 300 MG/ML  SOLN COMPARISON:  Chest radiograph earlier today. FINDINGS: CT CHEST FINDINGS Cardiovascular: Aortic atherosclerosis and tortuosity without aneurysm or acute aortic findings. No obvious central pulmonary embolus on this exam not tailored for pulmonary arteries S mint. Borderline cardiomegaly. There are coronary artery calcifications. No pericardial effusion. Mediastinum/Nodes: No mediastinal adenopathy or mass. No hilar adenopathy. Decompressed esophagus. No visible thyroid nodule. Lungs/Pleura: Motion artifact through the bases. Dependent atelectasis in the right greater than left lower lobe. No confluent consolidation. Mild central bronchial thickening without endobronchial lesion. No significant pleural effusion. Musculoskeletal: Remote right proximal humerus fracture with posttraumatic deformity. Exaggerated thoracic kyphosis. The bones are diffusely under mineralized. Mild T6 compression deformity. No posterior cortex involvement. CT ABDOMEN PELVIS FINDINGS Hepatobiliary: Nodular hepatic contours consistent with cirrhosis. There is no discrete focal hepatic lesion. Motion artifact through the liver limits assessment. Innumerable calcified gallstones. No definite pericholecystic inflammation. No biliary dilatation, although the common bile duct is poorly defined. Pancreas: No ductal dilatation or inflammation. Spleen: Splenomegaly with spleen measuring 15.1 x 6 x 13.4 cm (volume = 600 cm^3). Small subcapsular low-density posteriorly series 3, image 49, nonspecific. Adrenals/Urinary Tract: No adrenal nodule. Bilateral renal parenchymal atrophy. No hydronephrosis or focal renal lesion. Urinary bladder is obscured by streak artifact from left hip  arthroplasty. 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 The stomach is decompressed. There is no small bowel obstruction or inflammation. Small to moderate volume of colonic stool without colonic inflammation. The appendix is not visualized. Vascular/Lymphatic: Moderate to advanced aortic and branch atherosclerosis. No aortic aneurysm. There is no evidence of portal vein thrombosis, although phase of contrast limits portal vein assessment. Left upper quadrant collaterals with splenorenal shunting. Reproductive: Uterus primarily obscured by streak artifact from left hip arthroplasty. There is no obvious adnexal mass. Other: Minimal perihepatic ascites. Mild generalized edema of the subcutaneous and intra-abdominal fat. No free air. Surgical staples in the anterior abdominal wall. Musculoskeletal: Bones diffusely under mineralized. Marked compression deformity of L2 and L3 with buckling of the posterior cortex. Moderate L5 compression fracture. There is a fracture through the superior aspect of L1 vertebral body that is age indeterminate. Left hip arthroplasty. IMPRESSION: 1. Hepatic cirrhosis. Splenomegaly and left upper quadrant collaterals and splenorenal shunting. Minimal perihepatic ascites. 2. Cholelithiasis without definite pericholecystic inflammation. 3. Dependent atelectasis in both lungs.  No pneumonia. 4. Cardiomegaly.  Coronary artery calcifications. 5. Multiple thoracic and lumbar compression deformities. Marked compression deformity of L2 and L3 with buckling of the posterior cortex. Moderate L5 compression fracture. There is a fracture through the superior  aspect of L1 vertebral body that is age indeterminate. Mild T6 compression fracture. Recommend correlation with focal tenderness. Aortic Atherosclerosis (ICD10-I70.0). Electronically Signed   By: Keith Rake M.D.   On: 08/19/2022 18:56   CT Head Wo Contrast  Result Date: 08/19/2022 CLINICAL DATA:  Mental status change, unknown cause.  EXAM: CT HEAD WITHOUT CONTRAST TECHNIQUE: Contiguous axial images were obtained from the base of the skull through the vertex without intravenous contrast. RADIATION DOSE REDUCTION: This exam was performed according to the departmental dose-optimization program which includes automated exposure control, adjustment of the mA and/or kV according to patient size and/or use of iterative reconstruction technique. COMPARISON:  None Available. FINDINGS: Brain: No evidence of acute infarction, hemorrhage, hydrocephalus, extra-axial collection or mass lesion/mass effect. Prominence of the ventricles and sulci secondary to mild cerebral volume loss. Patchy areas of low-attenuation presumed chronic microvascular ischemic changes. Vascular: No hyperdense vessel or unexpected calcification. Skull: Normal. Negative for fracture or focal lesion. Sinuses/Orbits: No acute finding. Other: None. IMPRESSION: 1.  No acute intracranial abnormality. 2. Mild cerebral volume loss and chronic microvascular ischemic changes of the white matter. Electronically Signed   By: Keane Police D.O.   On: 08/19/2022 16:48   DG Chest Portable 1 View  Result Date: 08/19/2022 CLINICAL DATA:  Altered mental status EXAM: PORTABLE CHEST 1 VIEW COMPARISON:  Chest radiograph 05/01/2022 FINDINGS: The heart is enlarged, unchanged. The upper mediastinal contours are stable. There is no focal consolidation or pulmonary edema. There is no pleural effusion or pneumothorax There is no acute osseous abnormality. IMPRESSION: Unchanged cardiomegaly.  No focal consolidation or pleural effusion. Electronically Signed   By: Valetta Mole M.D.   On: 08/19/2022 16:25

## 2022-08-21 NOTE — Evaluation (Signed)
Occupational Therapy Evaluation Patient Details Name: Dorothy Cooper MRN: [id number] DOB: 15-Sep-1950 Today's Date: 08/21/2022   History of Present Illness Pt is a 72 yr old female who presented from SNF [facility]. Pt found to have acute metabolic and toxic encephalopathy, UTI, compression fx and TLSO to be donned when OOB and acute on chronic thrombocytopenia with elevated INR. Per chart on 02/23/22 was seen by oncology with concerns of  underlying bone marrow failure  PMH end stage renal disease, thrombocytopenia, cirrhosis due to unclear etiology, diastolic CHF, chronic anemia, asthma, COPD, diabetes mellitus, glaucoma, hyperlipidemia, hypertension, A-fib, migraine headaches.   Clinical Impression   Pt at this time very limited in any functional tasks or mobility. Pt required max to total care x2 for all bed level activities. Pt noted to have increase in tone in RUE vs LUE in session and also appeared to be leaning to R side within bed level. Attempted multiple trials of EOB/OOB activity but declined throughout the session. Pt currently with functional limitations due to the deficits listed below (see OT Problem List).  Pt will benefit from skilled OT to increase their safety and independence with ADL and functional mobility for ADL to facilitate discharge to venue listed below.        Recommendations for follow up therapy are one component of a multi-disciplinary discharge planning process, led by the attending physician.  Recommendations may be updated based on patient status, additional functional criteria and insurance authorization.   Follow Up Recommendations  Skilled nursing-short term rehab (<3 hours/day)    Assistance Recommended at Discharge Frequent or constant Supervision/Assistance  Patient can return home with the following Two people to help with walking and/or transfers;Two people to help with bathing/dressing/bathroom;Assistance with cooking/housework;Assistance with feeding;Direct  supervision/assist for medications management;Direct supervision/assist for financial management;Assist for transportation    Functional Status Assessment  Patient has had a recent decline in their functional status and demonstrates the ability to make significant improvements in function in a reasonable and predictable amount of time.  Equipment Recommendations  None recommended by OT (TBD)    Recommendations for Other Services       Precautions / Restrictions Precautions Precautions: Fall;Back Required Braces or Orthoses: Spinal Brace Spinal Brace: Thoracolumbosacral orthotic;Other (comment) Spinal Brace Comments: when OOB per orders, was already donned in supine upon PT and OT arrival Restrictions Weight Bearing Restrictions: No      Mobility Bed Mobility Overal bed mobility: Needs Assistance Bed Mobility: Rolling Rolling: Max assist, +2 for physical assistance         General bed mobility comments: max +2 for rolling bilat for trunk and LE management, total +2 for boost up and repositioning. Pt declines EOB or OOB attempt    Transfers                   General transfer comment: deffered transfers at this time due to pain and unable to follow commands      Balance                                           ADL either performed or assessed with clinical judgement   ADL Overall ADL's : Needs assistance/impaired Eating/Feeding: NPO;Bed level   Grooming: Total assistance;Bed level   Upper Body Bathing: Total assistance   Lower Body Bathing: Total assistance;+2 for physical assistance;+2 for safety/equipment;Bed level   Upper Body Dressing :  Total assistance;Bed level   Lower Body Dressing: Total assistance;+2 for physical assistance;+2 for safety/equipment;Bed level                 General ADL Comments: unable to complete more then bed level at this time     Vision Baseline Vision/History:  (Pt did not open eyes in session)        Perception     Praxis      Pertinent Vitals/Pain Pain Assessment Pain Assessment: Faces Faces Pain Scale: Hurts even more Pain Location: pt states "everywhere" Pain Descriptors / Indicators: Discomfort, Guarding, Grimacing Pain Intervention(s): Limited activity within patient's tolerance, Repositioned     Hand Dominance     Extremity/Trunk Assessment Upper Extremity Assessment Upper Extremity Assessment: RUE deficits/detail;LUE deficits/detail RUE Deficits / Details: BUE noted to increase in tone and R hand at risk for skin break down due to increase in flexion of digits. Pt demonstrated more reflexive responces the active ROM. RUE: Unable to fully assess due to pain RUE Sensation:  (difficult to determine) RUE Coordination: decreased fine motor;decreased gross motor LUE Deficits / Details: Pt noed to have edema and more reflexive movement vs functional LUE Sensation:  (difficult to assess) LUE Coordination: decreased fine motor;decreased gross motor   Lower Extremity Assessment Lower Extremity Assessment: Defer to PT evaluation   Cervical / Trunk Assessment Cervical / Trunk Assessment: Kyphotic;Other exceptions Cervical / Trunk Exceptions: TLSO donned   Communication Communication Communication: Expressive difficulties   Cognition Arousal/Alertness: Awake/alert Behavior During Therapy: Restless, Anxious Overall Cognitive Status: Difficult to assess                                 General Comments: Per chart when family was visiting pt they were able to speak, and in session very limited communication. Oriented to self and location of hospital, beyond this not oriented and pt is a questionable historian. Pt mostly groaning in pain during session     General Comments  mepilex applied over multiple areas on feet    Exercises Exercises:  (Provided gentle passive stretching of BUE and positiong of R hand due to risk of skin breakdown)   Shoulder  Instructions      Home Living Family/patient expects to be discharged to:: Skilled nursing facility                                 Additional Comments: [facility] per chart has been there for 3-4 months      Prior Functioning/Environment Prior Level of Function : Patient poor historian/Family not available                        OT Problem List: Decreased strength;Decreased range of motion;Decreased activity tolerance;Impaired balance (sitting and/or standing);Decreased coordination;Decreased cognition;Decreased safety awareness;Decreased knowledge of use of DME or AE;Decreased knowledge of precautions;Cardiopulmonary status limiting activity;Impaired tone;Impaired UE functional use;Pain      OT Treatment/Interventions: Self-care/ADL training;Therapeutic exercise;Neuromuscular education;DME and/or AE instruction;Therapeutic activities;Cognitive remediation/compensation;Visual/perceptual remediation/compensation;Patient/family education;Balance training    OT Goals(Current goals can be found in the care plan section) Acute Rehab OT Goals Patient Stated Goal: unable to report OT Goal Formulation: With patient Time For Goal Achievement: 09/04/22 Potential to Achieve Goals: Fair ADL Goals Pt Will Perform Eating: with min assist;sitting Pt Will Perform Grooming: with min assist;sitting Pt Will Perform Upper Body  Bathing: with mod assist;sitting Pt Will Perform Lower Body Bathing: with mod assist;sitting/lateral leans Additional ADL Goal #1: Pt will tolerate sitting EOB for 3 mins  OT Frequency: Min 3X/week    Co-evaluation PT/OT/SLP Co-Evaluation/Treatment: Yes Reason for Co-Treatment: Complexity of the patient's impairments (multi-system involvement) PT goals addressed during session: Mobility/safety with mobility;Balance OT goals addressed during session: ADL's and self-care      AM-PAC OT "6 Clicks" Daily Activity     Outcome Measure Help from another  person eating meals?: Total Help from another person taking care of personal grooming?: Total Help from another person toileting, which includes using toliet, bedpan, or urinal?: Total Help from another person bathing (including washing, rinsing, drying)?: Total Help from another person to put on and taking off regular upper body clothing?: Total Help from another person to put on and taking off regular lower body clothing?: Total 6 Click Score: 6   End of Session Nurse Communication: Mobility status  Activity Tolerance: Patient limited by pain Patient left: in bed;with call bell/phone within reach;with bed alarm set  OT Visit Diagnosis: Other abnormalities of gait and mobility (R26.89);Unsteadiness on feet (R26.81);Muscle weakness (generalized) (M62.81);Other symptoms and signs involving cognitive function;Pain Pain - Right/Left:  (general)                Time: 4383-8184 OT Time Calculation (min): 20 min Charges:  OT General Charges $OT Visit: 1 Visit OT Evaluation $OT Eval Moderate Complexity: [place] OTR/L  [place]  [phone] office number [phone] pager number   Joeseph Amor 08/21/2022, 11:32 AM

## 2022-08-21 NOTE — NC FL2 (Signed)
[place] MEDICAID FL2 LEVEL OF CARE SCREENING TOOL     IDENTIFICATION  Patient Name: Dorothy Cooper Birthdate: 1950-10-20 Sex: female Admission Date (Current Location): 08/19/2022  [facility] and [place] Number:  Herbalist and Address:  [facility] [place] [place] [address] [place], [place] [address]      Provider Number: [id number]  Attending Physician Name and Address:  Thurnell Lose, MD  Relative Name and Phone Number:  Kerrin Mo,    [phone]    Current Level of Care: Hospital Recommended Level of Care: [place] Prior Approval Number:    Date Approved/Denied:   PASRR Number: [id number]  Discharge Plan: SNF    Current Diagnoses: Patient Active Problem List   Diagnosis Date Noted   Pressure injury of skin 08/21/2022   Other pancytopenia ([place])    Acute metabolic encephalopathy 34/74/2595   UTI (urinary tract infection) 08/19/2022   Cirrhosis ([place]) 08/19/2022   DM2 (diabetes mellitus, type 2) ([place]) 08/19/2022   Paroxysmal atrial fibrillation ([place]) 08/19/2022   Elevated INR 08/19/2022   Chronic diastolic CHF (congestive heart failure) ([place]) 08/19/2022   Hypotension 08/19/2022   ESRD (end stage renal disease) ([place]) 08/19/2022   Thrombocytopenia ([place]) 08/19/2022   Compression fracture of lumbar vertebra ([facility]) 08/19/2022    Orientation RESPIRATION BLADDER Height & Weight      (Disoriented x4)  O2 (2L Nasal Cannula) Continent Weight: 128 lb 4.9 oz (58.2 kg) Height:  '5\' 2"'$  (157.5 cm)  BEHAVIORAL SYMPTOMS/MOOD NEUROLOGICAL BOWEL NUTRITION STATUS      Continent Diet (See discharge summary.)  AMBULATORY STATUS COMMUNICATION OF NEEDS Skin   Extensive Assist Verbally PU Stage and Appropriate Care, Skin abrasions   PU Stage 2 Dressing: Daily                   Personal Care Assistance Level of Assistance  Bathing, Feeding, Dressing Bathing Assistance: Maximum assistance Feeding assistance: Maximum  assistance Dressing Assistance: Maximum assistance     Functional Limitations Info  Sight, Hearing Sight Info: Adequate Hearing Info: Impaired      SPECIAL CARE FACTORS FREQUENCY  PT (By licensed PT), OT (By licensed OT)     PT Frequency: 5x/week OT Frequency: 5x/week            Contractures Contractures Info: Not present    Additional Factors Info  Code Status Code Status Info: DNR Allergies Info: Penicillins, Amlodipine, Aspirin, Gabapentin           Current Medications (08/21/2022):  This is the current hospital active medication list Current Facility-Administered Medications  Medication Dose Route Frequency Provider Last Rate Last Admin   acetaminophen (TYLENOL) tablet 650 mg  650 mg Oral Q6H PRN Doutova, Anastassia, MD       Or   acetaminophen (TYLENOL) suppository 650 mg  650 mg Rectal Q6H PRN Doutova, Anastassia, MD       cefTRIAXone (ROCEPHIN) 1 g in sodium chloride 0.9 % 100 mL IVPB  1 g Intravenous Q24H Doutova, Anastassia, MD 200 mL/hr at 08/20/22 2159 1 g at 08/20/22 2159   Chlorhexidine Gluconate Cloth 2 % PADS 6 each  6 each Topical Q0600 Roney Jaffe, MD   6 each at 08/21/22 0447   cholecalciferol (VITAMIN D3) 25 MCG (1000 UNIT) tablet 1,000 Units  1,000 Units Oral Daily Thurnell Lose, MD   1,000 Units at 08/21/22 1115   [START ON 08/22/2022] heparin injection 2,000 Units  2,000 Units Dialysis Once in dialysis Roney Jaffe,  MD       HYDROmorphone (DILAUDID) tablet 1 mg  1 mg Oral Q6H PRN Thurnell Lose, MD   1 mg at 08/20/22 1814   insulin aspart (novoLOG) injection 0-6 Units  0-6 Units Subcutaneous Q4H Toy Baker, MD   1 Units at 08/21/22 0446   lactulose (CHRONULAC) 10 GM/15ML solution 30 g  30 g Oral BID Thurnell Lose, MD   30 g at 08/21/22 1320   methocarbamol (ROBAXIN) 500 mg in dextrose 5 % 50 mL IVPB  500 mg Intravenous Q6H PRN Doutova, Anastassia, MD       midodrine (PROAMATINE) tablet 10 mg  10 mg Oral TID WC Doutova,  Anastassia, MD   10 mg at 08/21/22 1115   ondansetron (ZOFRAN) injection 4 mg  4 mg Intravenous Q6H PRN Kristopher Oppenheim, DO   4 mg at 08/20/22 0230   pantoprazole (PROTONIX) EC tablet 40 mg  40 mg Oral Daily Thurnell Lose, MD   40 mg at 08/21/22 1115   pravastatin (PRAVACHOL) tablet 20 mg  20 mg Oral q1800 Thurnell Lose, MD   20 mg at 08/20/22 1813   sertraline (ZOLOFT) tablet 50 mg  50 mg Oral Daily Thurnell Lose, MD   50 mg at 08/21/22 1115   traMADol (ULTRAM) tablet 50 mg  50 mg Oral Q6H PRN Thurnell Lose, MD   50 mg at 08/20/22 2248     Discharge Medications: Please see discharge summary for a list of discharge medications.  Relevant Imaging Results:  Relevant Lab Results:   Additional Information SSN: [id number]  Coralee [place], [place]

## 2022-08-21 NOTE — Progress Notes (Signed)
Patient having vaginal bleeding. MD notified.  Era Bumpers, RN

## 2022-08-21 NOTE — Progress Notes (Signed)
[facility] Progress Note   Subjective:   Pt seen in room. Resting with eyes closes. Speaking spontaneously but unable to understand what she is saying.   Objective Vitals:   08/21/22 0643 08/21/22 0800 08/21/22 0827 08/21/22 0900  BP: (!) 92/44 (!) 98/48 112/64 (!) 105/53  Pulse: 68 70 91 75  Resp: '15 19 15 19  ' Temp: 98 F (36.7 C) 97.7 F (36.5 C) 98 F (36.7 C)   TempSrc: Oral Oral Oral   SpO2: 98% 100% 93% 99%  Weight:      Height:       Physical Exam General: Elderly female, eyes closed, appears comfortable Heart: RRR, no murmurs, rubs or gallops Lungs: CTA bilaterally without wheezing, rhonchi or rales Abdomen: Soft, non-distended, +BS Extremities: no edema b/l lower extremities Dialysis Access:  LUE AVF + bruit  Additional Objective Labs: Basic Metabolic Panel: Recent Labs  Lab 08/19/22 1515 08/19/22 2237 08/20/22 0500 08/21/22 0313  NA 132*  --  131* 130*  K 4.1  --  3.9 3.4*  CL 92*  --  93* 92*  CO2 28  --  28 25  GLUCOSE 179*  --  125* 160*  BUN 52*  --  60* 72*  CREATININE 5.35*  --  5.56* 6.18*  CALCIUM 10.1  --  9.7 9.9  PHOS  --  4.0  --   --    Liver Function Tests: Recent Labs  Lab 08/19/22 1515 08/20/22 0500 08/21/22 0313  AST 60* 65* 33  ALT 33 28 25  ALKPHOS 75 62 70  BILITOT 2.2* 1.9* 2.1*  PROT 5.4* 5.2* 4.7*  ALBUMIN 2.1* 2.2* 2.0*   No results for input(s): "LIPASE", "AMYLASE" in the last 168 hours. CBC: Recent Labs  Lab 08/19/22 1515 08/19/22 1855 08/20/22 0500 08/21/22 0313  WBC 4.7  --  4.2  4.2 5.2  NEUTROABS 4.1  --  3.5 4.1  HGB 9.1*  --  7.9*  7.7* 8.2*  HCT 27.4*  --  24.0*  23.9* 24.1*  MCV 106.6*  --  107.1*  105.8* 101.7*  PLT 26* 21* 22*  22* 17*   Blood Culture    Component Value Date/Time   SDES IN/OUT CATH URINE 08/19/2022 2142   SPECREQUEST  08/19/2022 2142    NONE Performed at [facility] [address], [place], [place] [address]    CULT >=100,000 COLONIES/mL GRAM  NEGATIVE RODS (A) 08/19/2022 2142   REPTSTATUS PENDING 08/19/2022 2142    Cardiac Enzymes: Recent Labs  Lab 08/19/22 2237  CKTOTAL 48   CBG: Recent Labs  Lab 08/20/22 1209 08/20/22 1631 08/20/22 2154 08/21/22 0443 08/21/22 0810  GLUCAP 131* 123* 162* 153* 121*   Iron Studies:  Recent Labs    08/19/22 1855  IRON 23*  TIBC UNABLE TO CALCULATE  FERRITIN 815*   '@lablastinr3' @ Studies/Results: DG Chest Port 1 View  Result Date: 08/20/2022 CLINICAL DATA:  72 year old female with history of altered mental status. EXAM: PORTABLE CHEST 1 VIEW COMPARISON:  Chest x-ray 08/19/2022. FINDINGS: Lung volumes are low. No consolidative airspace disease. There is cephalization of the pulmonary vasculature and slight indistinctness of the interstitial markings suggestive of mild pulmonary edema. Trace right pleural effusion. No definite left pleural effusion. Mild cardiomegaly. The patient is rotated to the right on today's exam, resulting in distortion of the mediastinal contours and reduced diagnostic sensitivity and specificity for mediastinal pathology. Atherosclerotic calcifications in the thoracic aorta. IMPRESSION: 1. The appearance the chest suggests  mild congestive heart failure, as above. 2. Aortic atherosclerosis. Electronically Signed   By: Vinnie Langton M.D.   On: 08/20/2022 06:54   CT CHEST ABDOMEN PELVIS W CONTRAST  Result Date: 08/19/2022 CLINICAL DATA:  Sepsis. EXAM: CT CHEST, ABDOMEN, AND PELVIS WITH CONTRAST TECHNIQUE: Multidetector CT imaging of the chest, abdomen and pelvis was performed following the standard protocol during bolus administration of intravenous contrast. RADIATION DOSE REDUCTION: This exam was performed according to the departmental dose-optimization program which includes automated exposure control, adjustment of the mA and/or kV according to patient size and/or use of iterative reconstruction technique. CONTRAST:  14m OMNIPAQUE IOHEXOL 300 MG/ML  SOLN  COMPARISON:  Chest radiograph earlier today. FINDINGS: CT CHEST FINDINGS Cardiovascular: Aortic atherosclerosis and tortuosity without aneurysm or acute aortic findings. No obvious central pulmonary embolus on this exam not tailored for pulmonary arteries S mint. Borderline cardiomegaly. There are coronary artery calcifications. No pericardial effusion. Mediastinum/Nodes: No mediastinal adenopathy or mass. No hilar adenopathy. Decompressed esophagus. No visible thyroid nodule. Lungs/Pleura: Motion artifact through the bases. Dependent atelectasis in the right greater than left lower lobe. No confluent consolidation. Mild central bronchial thickening without endobronchial lesion. No significant pleural effusion. Musculoskeletal: Remote right proximal humerus fracture with posttraumatic deformity. Exaggerated thoracic kyphosis. The bones are diffusely under mineralized. Mild T6 compression deformity. No posterior cortex involvement. CT ABDOMEN PELVIS FINDINGS Hepatobiliary: Nodular hepatic contours consistent with cirrhosis. There is no discrete focal hepatic lesion. Motion artifact through the liver limits assessment. Innumerable calcified gallstones. No definite pericholecystic inflammation. No biliary dilatation, although the common bile duct is poorly defined. Pancreas: No ductal dilatation or inflammation. Spleen: Splenomegaly with spleen measuring 15.1 x 6 x 13.4 cm (volume = 600 cm^3). Small subcapsular low-density posteriorly series 3, image 49, nonspecific. Adrenals/Urinary Tract: No adrenal nodule. Bilateral renal parenchymal atrophy. No hydronephrosis or focal renal lesion. Urinary bladder is obscured by streak artifact from left hip arthroplasty. 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 The stomach is decompressed. There is no small bowel obstruction or inflammation. Small to moderate volume of colonic stool without colonic inflammation. The appendix is not visualized. Vascular/Lymphatic: Moderate  to advanced aortic and branch atherosclerosis. No aortic aneurysm. There is no evidence of portal vein thrombosis, although phase of contrast limits portal vein assessment. Left upper quadrant collaterals with splenorenal shunting. Reproductive: Uterus primarily obscured by streak artifact from left hip arthroplasty. There is no obvious adnexal mass. Other: Minimal perihepatic ascites. Mild generalized edema of the subcutaneous and intra-abdominal fat. No free air. Surgical staples in the anterior abdominal wall. Musculoskeletal: Bones diffusely under mineralized. Marked compression deformity of L2 and L3 with buckling of the posterior cortex. Moderate L5 compression fracture. There is a fracture through the superior aspect of L1 vertebral body that is age indeterminate. Left hip arthroplasty. IMPRESSION: 1. Hepatic cirrhosis. Splenomegaly and left upper quadrant collaterals and splenorenal shunting. Minimal perihepatic ascites. 2. Cholelithiasis without definite pericholecystic inflammation. 3. Dependent atelectasis in both lungs.  No pneumonia. 4. Cardiomegaly.  Coronary artery calcifications. 5. Multiple thoracic and lumbar compression deformities. Marked compression deformity of L2 and L3 with buckling of the posterior cortex. Moderate L5 compression fracture. There is a fracture through the superior aspect of L1 vertebral body that is age indeterminate. Mild T6 compression fracture. Recommend correlation with focal tenderness. Aortic Atherosclerosis (ICD10-I70.0). Electronically Signed   By: MKeith RakeM.D.   On: 08/19/2022 18:56   CT Head Wo Contrast  Result Date: 08/19/2022 CLINICAL DATA:  Mental status change, unknown cause. EXAM: CT HEAD  WITHOUT CONTRAST TECHNIQUE: Contiguous axial images were obtained from the base of the skull through the vertex without intravenous contrast. RADIATION DOSE REDUCTION: This exam was performed according to the departmental dose-optimization program which includes  automated exposure control, adjustment of the mA and/or kV according to patient size and/or use of iterative reconstruction technique. COMPARISON:  None Available. FINDINGS: Brain: No evidence of acute infarction, hemorrhage, hydrocephalus, extra-axial collection or mass lesion/mass effect. Prominence of the ventricles and sulci secondary to mild cerebral volume loss. Patchy areas of low-attenuation presumed chronic microvascular ischemic changes. Vascular: No hyperdense vessel or unexpected calcification. Skull: Normal. Negative for fracture or focal lesion. Sinuses/Orbits: No acute finding. Other: None. IMPRESSION: 1.  No acute intracranial abnormality. 2. Mild cerebral volume loss and chronic microvascular ischemic changes of the white matter. Electronically Signed   By: Keane Police D.O.   On: 08/19/2022 16:48   DG Chest Portable 1 View  Result Date: 08/19/2022 CLINICAL DATA:  Altered mental status EXAM: PORTABLE CHEST 1 VIEW COMPARISON:  Chest radiograph 05/01/2022 FINDINGS: The heart is enlarged, unchanged. The upper mediastinal contours are stable. There is no focal consolidation or pulmonary edema. There is no pleural effusion or pneumothorax There is no acute osseous abnormality. IMPRESSION: Unchanged cardiomegaly.  No focal consolidation or pleural effusion. Electronically Signed   By: Valetta Mole M.D.   On: 08/19/2022 16:25   Medications:  cefTRIAXone (ROCEPHIN)  IV 1 g (08/20/22 2159)   methocarbamol (ROBAXIN) IV      Chlorhexidine Gluconate Cloth  6 each Topical Q0600   cholecalciferol  1,000 Units Oral Daily   [START ON 08/22/2022] heparin  2,000 Units Dialysis Once in dialysis   insulin aspart  0-6 Units Subcutaneous Q4H   midodrine  10 mg Oral TID WC   pantoprazole  40 mg Oral Daily   pravastatin  20 mg Oral q1800   sertraline  50 mg Oral Daily    OP Dialysis Orders: [facility] 3h 55mn  400/500  62kg  2/2 bath P4   Hep 2000  AVF LUA - mircera 200 q2, last 8/12, due 8/26 -  doxercalciferol 1 ug IV tiw - last HD 8/19, min idwg 0- 1kg, 5 kg drop in edw last wk - last Hb 10.8 on 8/15, tsat 57%    CXR 8/21 - possibly mild CHF  Assessment/Plan: AMS - +low grade temp, possible UTI. on IV rocephin. Management per primary team.  2. Hypotension - sepsis vs other. Hb ranging from 7-9. On midodrine TID. Minimal UF with HD today due to soft BP.   3. Vertebral compression fx(s) - multiple per CT, not surgical candidate per NSGY 4. ESRD - on HD TTS. Has not missed HD, good compliance. HD today as tolerated.   5. Anemia esrd - Chronic. Variation in Hb since admission from 7-9s.  next esa due on 8/26. Last tsat high. Also with low platelets, transfusion ordered. Noted she has been seen by hematology and has refused bone marrow biopsy in the past.  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 Will hold IV vdra for now.  7. Cirrhosis - per CT scan today 8 .Dispo- pt with multiple chronic illnesses. Noted palliative care meeting yesterday, ongoing GYumadiscussions but possible transition to comfort care if no improvement     SAnice Paganini PA-C 08/21/2022, 10:20 AM  [facility] Pager: [phone]

## 2022-08-21 NOTE — Evaluation (Signed)
Physical Therapy Evaluation Patient Details Name: Dorothy Cooper MRN: [id number] DOB: 1950-11-16 Today's Date: 08/21/2022  History of Present Illness  Pt is a 72 yr old female who presented from SNF AMS. Pt found to have acute metabolic and toxic encephalopathy, UTI, compression fx and TLSO to be donned when OOB and acute on chronic thrombocytopenia with elevated INR. Per chart on 02/23/22 was seen by oncology with concerns of  underlying bone marrow failure  PMH end stage renal disease, thrombocytopenia, cirrhosis due to unclear etiology, diastolic CHF, chronic anemia, asthma, COPD, diabetes mellitus, glaucoma, hyperlipidemia, hypertension, A-fib, migraine headaches.  Clinical Impression   Pt presents with generalized weakness, decreased LE ROM, max-total difficulty performing bed mobility, and decreased activity tolerance vs baseline. Pt to benefit from acute PT to address deficits. Pt requiring max-total +2 for bedlevel assessment only today, pt declines EOB or OOB attempt even with PT and OT encouragement. PT unsure of pt's baseline functional status, no family in room. Pt will need return to SNF level of care post-acutely. PT to progress mobility as tolerated, and will continue to follow acutely.         Recommendations for follow up therapy are one component of a multi-disciplinary discharge planning process, led by the attending physician.  Recommendations may be updated based on patient status, additional functional criteria and insurance authorization.  Follow Up Recommendations Skilled nursing-short term rehab (<3 hours/day) Can patient physically be transported by private vehicle: No    Assistance Recommended at Discharge Frequent or constant Supervision/Assistance  Patient can return home with the following  Two people to help with walking and/or transfers;Two people to help with bathing/dressing/bathroom;Assistance with cooking/housework;Direct supervision/assist for financial  management;Assist for transportation;Help with stairs or ramp for entrance    Equipment Recommendations None recommended by PT  Recommendations for Other Services       Functional Status Assessment Patient has had a recent decline in their functional status and demonstrates the ability to make significant improvements in function in a reasonable and predictable amount of time.     Precautions / Restrictions Precautions Precautions: Fall;Back Required Braces or Orthoses: Spinal Brace Spinal Brace: Thoracolumbosacral orthotic;Other (comment) Spinal Brace Comments: when OOB per orders, was already donned in supine upon PT and OT arrival Restrictions Weight Bearing Restrictions: No      Mobility  Bed Mobility Overal bed mobility: Needs Assistance Bed Mobility: Rolling Rolling: Max assist, +2 for physical assistance         General bed mobility comments: max +2 for rolling bilat for trunk and LE management, total +2 for boost up and repositioning. Pt declines EOB or OOB attempt    Transfers                        Ambulation/Gait                  Stairs            Wheelchair Mobility    Modified Rankin (Stroke Patients Only)       Balance Overall balance assessment: Needs assistance                                           Pertinent Vitals/Pain Pain Assessment Pain Assessment: Faces Faces Pain Scale: Hurts even more Pain Location: pt states "everywhere" Pain Descriptors / Indicators: Discomfort, Guarding, Grimacing Pain Intervention(s): Limited  activity within patient's tolerance, Monitored during session, Repositioned    Home Living Family/patient expects to be discharged to:: Skilled nursing facility                   Additional Comments: [place] per chart has been there for 3-4 months    Prior Function Prior Level of Function : Patient poor historian/Family not available                      Hand Dominance        Extremity/Trunk Assessment   Upper Extremity Assessment Upper Extremity Assessment: Defer to OT evaluation RUE Deficits / Details: BUE noted to increase in tone and R hand at risk for skin break down due to increase in flexion of digits. Pt demonstrated more reflexive responces the active ROM.    Lower Extremity Assessment Lower Extremity Assessment: Generalized weakness (windswept appearance LEs knees pointed R, lacking approx 15 degrees knee extension bilat)    Cervical / Trunk Assessment Cervical / Trunk Assessment: Kyphotic;Other exceptions Cervical / Trunk Exceptions: TLSO donned  Communication   Communication: Expressive difficulties  Cognition Arousal/Alertness: Awake/alert Behavior During Therapy: Restless, Anxious Overall Cognitive Status: Difficult to assess                                 General Comments: Per chart when family was visiting pt they were able to speak, and in session very limited communication. Oriented to self and location of hospital, beyond this not oriented and pt is a questionable historian. Pt mostly groaning in pain during session        General Comments General comments (skin integrity, edema, etc.): mepilex applied over multiple areas on feet    Exercises     Assessment/Plan    PT Assessment Patient needs continued PT services  PT Problem List Decreased strength;Decreased mobility;Decreased activity tolerance;Decreased balance;Decreased knowledge of use of DME;Pain;Decreased safety awareness;Decreased knowledge of precautions;Decreased range of motion;Decreased skin integrity;Decreased cognition       PT Treatment Interventions DME instruction;Therapeutic activities;Gait training;Therapeutic exercise;Patient/family education;Balance training;Stair training;Functional mobility training    PT Goals (Current goals can be found in the Care Plan section)  Acute Rehab PT Goals PT Goal Formulation:  Patient unable to participate in goal setting Time For Goal Achievement: 09/04/22 Potential to Achieve Goals: Fair    Frequency Min 2X/week     Co-evaluation PT/OT/SLP Co-Evaluation/Treatment: Yes Reason for Co-Treatment: Complexity of the patient's impairments (multi-system involvement) PT goals addressed during session: Mobility/safety with mobility;Balance OT goals addressed during session: ADL's and self-care       AM-PAC PT "6 Clicks" Mobility  Outcome Measure Help needed turning from your back to your side while in a flat bed without using bedrails?: A Lot Help needed moving from lying on your back to sitting on the side of a flat bed without using bedrails?: Total Help needed moving to and from a bed to a chair (including a wheelchair)?: Total Help needed standing up from a chair using your arms (e.g., wheelchair or bedside chair)?: Total Help needed to walk in hospital room?: Total Help needed climbing 3-5 steps with a railing? : Total 6 Click Score: 7    End of Session Equipment Utilized During Treatment: Back brace Activity Tolerance: Patient limited by pain;Patient limited by fatigue Patient left: in bed;with call bell/phone within reach;with bed alarm set Nurse Communication: Mobility status PT Visit Diagnosis: Other  abnormalities of gait and mobility (R26.89);Muscle weakness (generalized) (M62.81)    Time: 2179-8102 PT Time Calculation (min) (ACUTE ONLY): 19 min   Charges:   PT Evaluation $PT Eval Low Complexity: 1 Low          Juanette Urizar S, PT DPT Acute Rehabilitation Services Pager [phone]  Office [phone]   [place] E Ruffin Pyo 08/21/2022, 11:26 AM

## 2022-08-21 NOTE — Progress Notes (Signed)
EEG complete - results pending 

## 2022-08-22 DIAGNOSIS — R066 Hiccough: Secondary | ICD-10-CM

## 2022-08-22 DIAGNOSIS — R52 Pain, unspecified: Secondary | ICD-10-CM

## 2022-08-22 DIAGNOSIS — R4182 Altered mental status, unspecified: Secondary | ICD-10-CM | POA: Diagnosis not present

## 2022-08-22 DIAGNOSIS — K7460 Unspecified cirrhosis of liver: Secondary | ICD-10-CM | POA: Diagnosis not present

## 2022-08-22 DIAGNOSIS — G9341 Metabolic encephalopathy: Secondary | ICD-10-CM | POA: Diagnosis not present

## 2022-08-22 DIAGNOSIS — N186 End stage renal disease: Secondary | ICD-10-CM | POA: Diagnosis not present

## 2022-08-22 DIAGNOSIS — I5032 Chronic diastolic (congestive) heart failure: Secondary | ICD-10-CM | POA: Diagnosis not present

## 2022-08-22 LAB — COMPREHENSIVE METABOLIC PANEL
ALT: 28 U/L (ref 0–44)
AST: 47 U/L — ABNORMAL HIGH (ref 15–41)
Albumin: 2 g/dL — ABNORMAL LOW (ref 3.5–5.0)
Alkaline Phosphatase: 71 U/L (ref 38–126)
Anion gap: 8 (ref 5–15)
BUN: 31 mg/dL — ABNORMAL HIGH (ref 8–23)
CO2: 29 mmol/L (ref 22–32)
Calcium: 8.7 mg/dL — ABNORMAL LOW (ref 8.9–10.3)
Chloride: 95 mmol/L — ABNORMAL LOW (ref 98–111)
Creatinine, Ser: 3.59 mg/dL — ABNORMAL HIGH (ref 0.44–1.00)
GFR, Estimated: 13 mL/min — ABNORMAL LOW (ref >60)
Glucose, Bld: 107 mg/dL — ABNORMAL HIGH (ref 70–99)
Potassium: 2.9 mmol/L — ABNORMAL LOW (ref 3.5–5.1)
Sodium: 132 mmol/L — ABNORMAL LOW (ref 135–145)
Total Bilirubin: 2.7 mg/dL — ABNORMAL HIGH (ref 0.3–1.2)
Total Protein: 4.7 g/dL — ABNORMAL LOW (ref 6.5–8.1)

## 2022-08-22 LAB — CBC WITH DIFFERENTIAL/PLATELET
Abs Immature Granulocytes: 0 10*3/uL (ref 0.00–0.07)
Basophils Absolute: 0 10*3/uL (ref 0.0–0.1)
Basophils Relative: 0 %
Eosinophils Absolute: 0 10*3/uL (ref 0.0–0.5)
Eosinophils Relative: 0 %
HCT: 23.1 % — ABNORMAL LOW (ref 36.0–46.0)
Hemoglobin: 7.7 g/dL — ABNORMAL LOW (ref 12.0–15.0)
Lymphocytes Relative: 3 %
Lymphs Abs: 0.2 10*3/uL — ABNORMAL LOW (ref 0.7–4.0)
MCH: 33.9 pg (ref 26.0–34.0)
MCHC: 33.3 g/dL (ref 30.0–36.0)
MCV: 101.8 fL — ABNORMAL HIGH (ref 80.0–100.0)
Monocytes Absolute: 0.3 10*3/uL (ref 0.1–1.0)
Monocytes Relative: 5 %
Neutro Abs: 4.7 10*3/uL (ref 1.7–7.7)
Neutrophils Relative %: 92 %
Platelets: 17 10*3/uL — CL (ref 150–400)
RBC: 2.27 MIL/uL — ABNORMAL LOW (ref 3.87–5.11)
RDW: 16.7 % — ABNORMAL HIGH (ref 11.5–15.5)
WBC: 5.1 10*3/uL (ref 4.0–10.5)
nRBC: 0 % (ref 0.0–0.2)
nRBC: 1 /100 WBC — ABNORMAL HIGH

## 2022-08-22 LAB — URINE CULTURE: Culture: >=100000 — AB

## 2022-08-22 LAB — PREPARE PLATELET PHERESIS: Unit division: 0

## 2022-08-22 LAB — BPAM PLATELET PHERESIS
Blood Product Expiration Date: 202308232359
ISSUE DATE / TIME: 202308220625
Unit Type and Rh: 5100

## 2022-08-22 LAB — MAGNESIUM: Magnesium: 2.1 mg/dL (ref 1.7–2.4)

## 2022-08-22 LAB — GLUCOSE, CAPILLARY
Glucose-Capillary: 84 mg/dL (ref 70–99)
Glucose-Capillary: 108 mg/dL — ABNORMAL HIGH (ref 70–99)

## 2022-08-22 LAB — AMMONIA: Ammonia: 31 umol/L (ref 9–35)

## 2022-08-22 LAB — BRAIN NATRIURETIC PEPTIDE: B Natriuretic Peptide: 496.7 pg/mL — ABNORMAL HIGH (ref 0.0–100.0)

## 2022-08-22 LAB — PROTIME-INR
INR: 2.5 — ABNORMAL HIGH (ref 0.8–1.2)
Prothrombin Time: 26.5 seconds — ABNORMAL HIGH (ref 11.4–15.2)

## 2022-08-22 MED ORDER — HALOPERIDOL LACTATE 5 MG/ML IJ SOLN: 2.0000 mg | Freq: Four times a day (QID) | INTRAMUSCULAR | Status: DC | PRN

## 2022-08-22 MED ORDER — HALOPERIDOL 1 MG PO TABS: 2.0000 mg | ORAL_TABLET | Freq: Four times a day (QID) | ORAL | Status: DC | PRN

## 2022-08-22 MED ORDER — ONDANSETRON HCL 4 MG/2ML IJ SOLN: 4.0000 mg | Freq: Four times a day (QID) | INTRAMUSCULAR | Status: DC | PRN

## 2022-08-22 MED ORDER — ONDANSETRON 4 MG PO TBDP: 4.0000 mg | ORAL_TABLET | Freq: Four times a day (QID) | ORAL | Status: DC | PRN

## 2022-08-22 MED ORDER — DIPHENHYDRAMINE HCL 50 MG/ML IJ SOLN: 12.5000 mg | INTRAMUSCULAR | Status: DC | PRN

## 2022-08-22 MED ORDER — SODIUM CHLORIDE 0.9 % IV SOLN
12.5000 mg | Freq: Once | INTRAVENOUS | Status: AC
  Administered 2022-08-22: 12.5 mg via INTRAVENOUS
  Filled 2022-08-22: qty 0.5

## 2022-08-22 MED ORDER — LORAZEPAM 2 MG/ML PO CONC: 1.0000 mg | ORAL | Status: DC | PRN

## 2022-08-22 MED ORDER — HALOPERIDOL LACTATE 2 MG/ML PO CONC: 2.0000 mg | Freq: Four times a day (QID) | ORAL | Status: DC | PRN

## 2022-08-22 MED ORDER — POLYVINYL ALCOHOL 1.4 % OP SOLN: 1.0000 [drp] | Freq: Four times a day (QID) | OPHTHALMIC | Status: DC | PRN

## 2022-08-22 MED ORDER — GLYCOPYRROLATE 0.2 MG/ML IJ SOLN: 0.2000 mg | INTRAMUSCULAR | Status: DC | PRN

## 2022-08-22 MED ORDER — LORAZEPAM 1 MG PO TABS: 1.0000 mg | ORAL_TABLET | ORAL | Status: DC | PRN

## 2022-08-22 MED ORDER — BIOTENE DRY MOUTH MT LIQD
15.0000 mL | Freq: Two times a day (BID) | OROMUCOSAL | Status: DC
  Administered 2022-08-22 – 2022-08-25 (×4): 15 mL via TOPICAL

## 2022-08-22 MED ORDER — SODIUM CHLORIDE 0.9 % IV SOLN
500.0000 mg | INTRAVENOUS | Status: AC
  Administered 2022-08-22 – 2022-08-23 (×2): 500 mg via INTRAVENOUS
  Filled 2022-08-22 (×3): qty 10

## 2022-08-22 MED ORDER — GLYCOPYRROLATE 0.2 MG/ML IJ SOLN
0.2000 mg | INTRAMUSCULAR | Status: DC | PRN
  Administered 2022-08-24: 0.2 mg via INTRAVENOUS
  Filled 2022-08-22: qty 1

## 2022-08-22 MED ORDER — LORAZEPAM 2 MG/ML IJ SOLN
1.0000 mg | INTRAMUSCULAR | Status: DC | PRN
  Administered 2022-08-24: 1 mg via INTRAVENOUS
  Filled 2022-08-22: qty 1

## 2022-08-22 MED ORDER — GLYCOPYRROLATE 1 MG PO TABS: 1.0000 mg | ORAL_TABLET | ORAL | Status: DC | PRN

## 2022-08-22 MED ORDER — HYDROMORPHONE HCL 1 MG/ML IJ SOLN
0.2500 mg | INTRAMUSCULAR | Status: DC | PRN
  Administered 2022-08-22 – 2022-08-23 (×7): 0.25 mg via INTRAVENOUS
  Filled 2022-08-22 (×7): qty 0.5

## 2022-08-22 MED ORDER — CHLORPROMAZINE HCL 25 MG/ML IJ SOLN: 12.5000 mg | Freq: Four times a day (QID) | INTRAMUSCULAR | Status: DC | PRN

## 2022-08-22 NOTE — Plan of Care (Signed)
Went into patient's room-was informed that she was made comfort measures only a few minutes ago. Asked the family if they needed anything for me and they politely declined. Neurology will be available as needed.  Please call with questions.  -- Amie Portland, MD Neurologist [facility] Pager: [phone]

## 2022-08-22 NOTE — Progress Notes (Addendum)
Daily Progress Note   Patient Name: Dorothy Cooper       Date: 08/22/2022 DOB: 1950/11/02  Age: 72 y.o. MRN#: [id number] Attending Physician: Albertine Patricia, MD Primary Care Physician: Pcp, No Admit Date: 08/19/2022  Reason for Consultation/Follow-up: Establishing goals of care  Subjective: Chart review performed. Received report from primary RN - no acute concerns. RN reports patient remains mostly unchanged since yesterday; minimal responsiveness, not eating. Noted in chart patient has persistent hypoglycemia despite OJ and dextrose was initiated.   Went to visit patient at bedside - granddaughter/[name] and daughter/[name] present with Dr. Waldron Labs.  Therapeutic listening and emotional support provided after Dr. Waldron Labs left room. [name] and Lelon Frohlich were joined by other family Denyse Amass and [place]. Family are understandably tearful about her poor prognosis and unchanged clinical status after HD yesterday. Family indicate they are interested in patient's transfer to residential hospice - requesting [facility]. [name] called hospice facility as she has a working relationship with them. They informed her no beds were available today; however, once they receive information from [facility], she would be first on the waitlist.   Lelon Frohlich called her sister/[name] - [name] also agrees to residential hospice transfer.  Reviewed who could give consent for patient's transfer to hospice since she is legally married; however, husband estranged for 9 years and no one has contact information for him. Reviewed legal next of kin decision makers would be patient's daughters. Daughters clearly rely on [name] for assistance with decision making.  Natural trajectory and expectations at EOL reviewed.  We talked  about transition to comfort measures in house and what that would entail inclusive of medications to control pain, dyspnea, agitation, nausea, and itching. We discussed stopping all unnecessary measures such as blood draws, needle sticks, oxygen, antibiotics, CBGs/insulin, cardiac monitoring, IVF, dialysis, and frequent vital signs. [name] requests to continue dextrose, is ok with stopping CBGs.  Family spent a lengthy time trying to discuss options with patient in hopes she could express her wishes. Patient's speech is quiet, mumbled, and largely unintelligible. Words that family and I were able to hear: hurting, pain, pain pill, let me go. Patient also shows non-verbal signs of pain/discomfort. Family are understandably tearful but find relief that patient was able to express her wishes to pass peacefully and to "let me go." Family want to  support these wishes. Their very hopeful patient will remain stable enough for transfer to residential hospice to be closer to family; however, they also understand with providing pain management in context of hypotension patient's window for low risk transfer may close - they express understanding and are willing to continue transfer discussions pending her stability. Patient also continuously hiccups during visit, which causes her pain - will provide symptom management to assist. Patient's eyes remain closed for entirety of visit. No respiratory distress, increased work of breathing, or secretions noted.   Provided cups of coffee to family.   All questions and concerns addressed. Encouraged to call with questions and/or concerns. PMT card provided.  Length of Stay: 2  Current Medications: Scheduled Meds:   Chlorhexidine Gluconate Cloth  6 each Topical Q0600   cholecalciferol  1,000 Units Oral Daily   insulin aspart  0-6 Units Subcutaneous Q4H   lactulose  30 g Oral BID   midodrine  10 mg Oral TID WC   pantoprazole  40 mg Oral Daily   pravastatin  20 mg Oral  q1800   sertraline  50 mg Oral Daily    Continuous Infusions:  cefTRIAXone (ROCEPHIN)  IV 1 g (08/22/22 0419)   dextrose 10 mL/hr at 08/21/22 2114   methocarbamol (ROBAXIN) IV      PRN Meds: acetaminophen **OR** acetaminophen, HYDROmorphone, methocarbamol (ROBAXIN) IV, ondansetron (ZOFRAN) IV, traMADol  Physical Exam Vitals and nursing note reviewed.  Constitutional:      General: She is not in acute distress.    Appearance: She is cachectic. She is ill-appearing.  Pulmonary:     Effort: No respiratory distress.  Skin:    General: Skin is warm and dry.  Neurological:     Mental Status: She is lethargic.     Motor: Weakness present.  Psychiatric:        Speech: Speech is slurred.             Vital Signs: BP (!) 104/41 (BP Location: Right Arm)   Pulse 72   Temp 98.6 F (37 C) (Oral)   Resp 20   Ht '5\' 2"'$  (1.575 m)   Wt 58.2 kg   LMP  (LMP Unknown)   SpO2 99%   BMI 23.47 kg/m  SpO2: SpO2: 99 % O2 Device: O2 Device: Nasal Cannula O2 Flow Rate: O2 Flow Rate (L/min): 2 L/min  Intake/output summary:  Intake/Output Summary (Last 24 hours) at 08/22/2022 0843 Last data filed at 08/22/2022 0419 Gross per 24 hour  Intake 100 ml  Output 300.3 ml  Net -200.3 ml   LBM: Last BM Date : 08/22/22 Baseline Weight: Weight: 58.2 kg Most recent weight: Weight: 58.2 kg       Palliative Assessment/Data: PPS 10-20%      Patient Active Problem List   Diagnosis Date Noted   Pressure injury of skin 08/21/2022   Other pancytopenia (HCC)    Acute metabolic encephalopathy 44/31/5400   UTI (urinary tract infection) 08/19/2022   Cirrhosis ([place]) 08/19/2022   DM2 (diabetes mellitus, type 2) (HCC) 08/19/2022   Paroxysmal atrial fibrillation (HCC) 08/19/2022   Elevated INR 08/19/2022   Chronic diastolic CHF (congestive heart failure) (HCC) 08/19/2022   Hypotension 08/19/2022   ESRD (end stage renal disease) ([place]) 08/19/2022   Thrombocytopenia ([place]) 08/19/2022   Compression  fracture of lumbar vertebra ([place]) 08/19/2022    Palliative Care Assessment & Plan   Patient Profile: 72 y.o. female  with past medical history of end-stage renal disease on hemodialysis  Tuesday/Thursday/ Saturday, thrombocytopenia, cirrhosis  unclear reason, diastolic CHF, chronic anemia, asthma, COPD, DM2, glaucoma, HLD, HTN pancytopenia, on eliquis for A.fib, migraine headaches presented [facility] ED on [facility]19[facility] with AMS. Patient was admitted on 08/19/2022 with acute metabolic encephalopathy, UTI, cirrhosis, hypotension, thrombocytopenia, cirrhosis, L and T-spine compression fracture. Neurosurgery states patient is not a surgical candidate; only option is TLSO brace.   Assessment: Principal Problem:   Acute metabolic encephalopathy Active Problems:   UTI (urinary tract infection)   Cirrhosis (HCC)   DM2 (diabetes mellitus, type 2) (HCC)   Paroxysmal atrial fibrillation (HCC)   Elevated INR   Chronic diastolic CHF (congestive heart failure) (HCC)   Hypotension   ESRD (end stage renal disease) (HCC)   Thrombocytopenia (HCC)   Compression fracture of lumbar vertebra (HCC)   Pressure injury of skin   Other pancytopenia ([place])   Terminal care  Recommendations/Plan: Initiated full comfort measures Continue DNR/DNI as previously documented Family requesting residential hospice transfer to [place] notified and consult placed No further HD Added orders for EOL symptom management and to reflect full comfort measures, as well as discontinued orders that were not focused on comfort Continue palliative wound care Unrestricted visitation orders were placed per current [place] EOL visitation policy  Nursing to provide frequent assessments and administer PRN medications as clinically necessary to ensure EOL comfort PMT will continue to follow and support holistically  Symptom Management Dilaudid PRN pain/RR>25/distress/increased work of  breathing Continue robaxin PRN muscle spasms Thorazine once for hiccups; PRN doses for breakthrough symptoms Tylenol PRN pain/fever Biotin twice daily Benadryl PRN itching Robinul PRN secretions Haldol PRN agitation/delirium Ativan PRN anxiety/seizure/sleep/distress Zofran PRN nausea/vomiting Liquifilm Tears PRN dry eye  **ADDENDUM** Received notification from PMT RN that [name] called with concerns about a new lab result.   Went to visit family at bedside - Jasmine Awe present. [name] expressed concern that patient's urine culture returned with ESBL per primary RN. She questions treating the infection and if this would change patient's trajectory. Reviewed antibiotics in the context of comfort measures; unfortunately, this would likely not change her overall outcome. She would like patient treated with antibiotics for goal of comfort. Dr. Waldron Labs entered room and discussed results and treatment plan.  [name] also expressed concern that insurance will not cover non-emergent transport to hospice facility of their choice because it's over 50 miles from hospital. Emotional support provided. Reviewed option of finding facility within the 50 miles and also allowing family to be closer than current location at [place] is agreeable and requests we look into the area of [place] - will notify TOC.  Recommendations: Antibiotics for ESBL  TOC notified of family's new request for hospice facility search in [place] and Additional Recommendations: Limitations on Scope of Treatment: Full Comfort Care  Code Status:    Code Status Orders  (From admission, onward)           Start     Ordered   08/20/22 1724  Do not attempt resuscitation (DNR)  Continuous       Question Answer Comment  In the event of cardiac or respiratory ARREST Do not call a "code blue"   In the event of cardiac or respiratory ARREST Do not perform Intubation, CPR,  defibrillation or ACLS   In the event of cardiac or respiratory ARREST Use medication by any route, position, wound care, and other measures to relive pain and  suffering. May use oxygen, suction and manual treatment of airway obstruction as needed for comfort.      08/20/22 1723           Code Status History     Date Active Date Inactive Code Status Order ID Comments User Context   08/19/2022 2236 08/20/2022 1723 Full Code [id number]  Toy Baker, MD ED       Prognosis:  < 2 weeks  Discharge Planning: Hospice facility vs hospital death  Care plan was discussed with Dr. Waldron Labs, primary RN, patient's family, TOC, Dr. Jonnie Finner, Anice Paganini, PA/Nephrology  Thank you for allowing the Palliative Medicine Team to assist in the care of this patient.   Total Time 120 minutes Prolonged Time Billed  yes       Greater than 50%  of this time was spent counseling and coordinating care related to the above assessment and plan.  Lin Landsman, NP  Please contact Palliative Medicine Team phone at [phone] for questions and concerns.   *Portions of this note are a verbal dictation therefore any spelling and/or grammatical errors are due to the "[place] [employer]" system interpretation.

## 2022-08-22 NOTE — Progress Notes (Signed)
  [facility] Progress Note   Informed that patient is now comfort care. Nephrology will sign off. Please call us if we can be of any further assistance.  Anice Paganini, PA-C 08/22/2022, 12:05 PM  [facility] Pager: [phone]

## 2022-08-22 NOTE — Progress Notes (Signed)
Pt has ESBL inection on urine.Was informed by infection control.Informed family and placed patient on contact precaution

## 2022-08-22 NOTE — Progress Notes (Signed)
This chaplain responded to unit page for a chaplain presence as the Pt. moves to comfort care.    This chaplain reviewed the chart notes and understands Dorothy Cooper visited the Pt. and family on Tuesday. This chaplain understands from conversation with the Pt. RN a F/U visit from [facility] is appropriate this afternoon.   Chaplain Sallyanne Kuster [phone]

## 2022-08-22 NOTE — Progress Notes (Addendum)
   08/22/22 1500  Clinical Encounter Type  Visited With Patient;Family  Visit Type Follow-up;Spiritual support;Other (Comment) (Patient placed on comfort care)  Referral From Family  Consult/Referral To Chaplain  Spiritual Encounters  Spiritual Needs Prayer;Emotional   In follow-up to previous visit, Chaplain Tye Maryland was asked to support family members following decision to place patient on comfort care. Chaplain encouraged story telling and sharing of special memories which they shared as family.  Family will decide if and where patient will be moved for hospice care.  As requested, Chaplain offered prayer for peace.   Melody Haver, Resident Chaplain [phone]

## 2022-08-22 NOTE — Progress Notes (Signed)
Pt's family wanted to talk with the MD about the infection.Informed MD

## 2022-08-22 NOTE — TOC Progression Note (Signed)
Transition of Care [facility]) - Progression Note    Patient Details  Name: Dorothy Cooper MRN: [id number] Date of Birth: [birth year]  Transition of Care [facility]) CM/SW [place], [place] Phone Number: 08/22/2022, 2:04 PM  Clinical Narrative:    CSW met with family at bedside to discuss hospice options. Family initially wanted pt to go to [facility], however it is outside the 50 mile radius, and medicare won't pay for transport. PTAR qouted 2000, family not able to pay that. Options given for closer hospice, family reviewing. TOC will continue to follow for disposition.   Expected Discharge Plan: [place] Barriers to Discharge: Continued Medical Work up  Expected Discharge Plan and Services Expected Discharge Plan: [place] Choice: [place] arrangements for the past 2 months: [place]                                       Social Determinants of Health (SDOH) Interventions    Readmission Risk Interventions     No data to display

## 2022-08-22 NOTE — Progress Notes (Signed)
Nutrition Brief Note  Chart reviewed. Pt now transitioning to comfort care. Pt with diet ordered, allow comfort feed per pt wishes.  No further nutrition interventions planned at this time. Please re-consult as needed.   Hermina Barters RD, LDN Clinical Dietitian See Shea Evans for contact information.

## 2022-08-22 NOTE — Progress Notes (Signed)
PROGRESS NOTE                                                                                                                                                                                                             Patient Demographics:    Dorothy Cooper, is a 72 y.o. female, DOB - 1950-07-21, DZH:[id number]  Outpatient Primary MD for the patient is Pcp, No    LOS - 2  Admit date - 08/19/2022    Chief Complaint  Patient presents with   Altered Mental Status       Brief Narrative (HPI from H&P)    72 y.o. 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 In the ER she was found to have toxic and metabolic encephalopathy, acute on chronic back and abdominal pain, UTI, acute on chronic thrombocytopenia with elevated INR.   Subjective:   Patient in bed, somnolent but in no discomfort, Granddaughter at bedside reports grandmother with significant hiccups this morning.    Cannot answer questions or follow commands reliably   Assessment  & Plan :   Acute metabolic and toxic encephalopathy.   Multifactorial due to combination of UTI, acute on chronic pain and being on narcotics, to a much lesser degree hyperammonemia from hepatic encephalopathy (ammonia level was at 50 ).  ESBL UTI -Treated with meropenem  T and L-spine compression fractures.  Age-indeterminate.    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  On TTS schedule.  Nephrology has been called.  Paroxysmal atrial fibrillation.  Mali vas 2 score > 3.    Hypotension.   Chr pain   COPD/asthma.   Dyslipidemia.    Chronic intermittent Vaginal bleed  DM type II.     Goals of care discussion -Rate of input greatly appreciated, patient with multiple comorbidities, and significant deterioration recently, had significant organ failures  including liver cirrhosis, ESRD, pancytopenia and progressive encephalopathy,  as well she is with significant chronic pain syndrome, requiring significant dose of narcotics leading to significant  encephalopathy, as well she does appear uncomfortable today due to pain, after discussion with palliative medicine decision has been made to proceed with comfort measures given the nature of her irreversible organ dysfunctions.  Lab Results  Component Value Date   HGBA1C 4.7 (L) 08/19/2022    CBG (last 3)  Recent Labs    08/21/22 2105 08/22/22 0306 08/22/22 0811  GLUCAP 181* 84 108*         Condition -comfort care  Family Communication  :    Granddaughter [name] at bedside today  Code Status : DNR  Consults  :  Renal, Haem, neuro, palliative  PUD Prophylaxis :     Procedures  :     CT Head - Non acute  CT Chest - 1. Hepatic cirrhosis. Splenomegaly and left upper quadrant collaterals and splenorenal shunting. Minimal perihepatic ascites. 2. Cholelithiasis without definite pericholecystic inflammation. 3. Dependent atelectasis in both lungs.  No pneumonia. 4. Cardiomegaly.  Coronary artery calcifications. 5. Multiple thoracic and lumbar compression deformities. Marked compression deformity of L2 and L3 with buckling of the posterior cortex. Moderate L5 compression fracture. There is a fracture through the superior aspect of L1 vertebral body that is age indeterminate. Mild T6 compression fracture.      Disposition Plan  :     Status is: Observation  DVT Prophylaxis  :       Lab Results  Component Value Date   PLT 17 (LL) 08/22/2022    Diet :  Diet Order             Diet regular Room service appropriate? Yes; Fluid consistency: Thin  Diet effective now                    Inpatient Medications  Scheduled Meds:  antiseptic oral rinse  15 mL Topical BID   Continuous Infusions:  chlorproMAZINE (THORAZINE) 12.5 mg in sodium chloride 0.9 % 25 mL IVPB     dextrose 10 mL/hr at 08/21/22 2114   meropenem (MERREM) IV     methocarbamol (ROBAXIN) IV     PRN Meds:.acetaminophen **OR** acetaminophen, chlorproMAZINE (THORAZINE) 12.5 mg in sodium chloride 0.9 % 25 mL IVPB, diphenhydrAMINE, glycopyrrolate **OR** glycopyrrolate **OR** glycopyrrolate, haloperidol **OR** haloperidol **OR** haloperidol lactate, HYDROmorphone (DILAUDID) injection, LORazepam **OR** LORazepam **OR** LORazepam, methocarbamol (ROBAXIN) IV, ondansetron **OR** ondansetron (ZOFRAN) IV, polyvinyl alcohol     Phillips Climes M.D on 08/22/2022 at 2:35 PM  To page go to [email or web address]   [facility] -  Office  [phone]  See all Orders from today for further details    Objective:   Vitals:   08/22/22 0200 08/22/22 0204 08/22/22 0341 08/22/22 0800  BP: (!) 77/44 (!) 103/41 (!) 104/43 (!) 104/41  Pulse: 75 76 72   Resp: (!) 24 (!) '24 20 20  '$ Temp:  98.8 F (37.1 C) 98.9 F (37.2 C) 98.6 F (37 C)  TempSrc:  Oral Oral Oral  SpO2: 100% 100% 99%   Weight:      Height:        Wt Readings from Last 3 Encounters:  08/20/22 58.2 kg  05/01/22 72.6 kg     Intake/Output Summary (Last 24 hours) at 08/22/2022 1435 Last data filed at 08/22/2022 0419 Gross per 24 hour  Intake 100 ml  Output 300.3 ml  Net -200.3 ml  Physical Exam  Lethargic, unresponsive, unable to answer questions appropriately, frail, chronically ill-appearing Symmetrical Chest wall movement, diminished air entry at the bases RRR,No  Gallops,Rubs or new Murmurs, No Parasternal Heave +ve B.Sounds, No Cyanosis, Clubbing or edema, No new Rash or bruise          Data Review:    CBC Recent Labs  Lab 08/19/22 1515 08/19/22 1855 08/20/22 0500 08/21/22 0313 08/22/22 0742  WBC 4.7  --  4.2  4.2 5.2 5.1  HGB 9.1*  --  7.9*  7.7* 8.2* 7.7*  HCT 27.4*  --  24.0*  23.9* 24.1* 23.1*  PLT 26* 21* 22*  22* 17* 17*  MCV 106.6*  --  107.1*  105.8* 101.7* 101.8*  MCH 35.4*  --  35.3*  34.1* 34.6* 33.9  MCHC 33.2  --  32.9  32.2 34.0 33.3  RDW 17.0*  --  17.0*  16.9* 16.6* 16.7*  LYMPHSABS 0.3*  --  0.2* 0.4* 0.2*  MONOABS 0.3  --  0.4 0.5 0.3  EOSABS 0.0  --  0.0 0.0 0.0  BASOSABS 0.0  --  0.0 0.0 0.0    Electrolytes Recent Labs  Lab 08/19/22 1515 08/19/22 1812 08/19/22 1855 08/19/22 2237 08/19/22 2358 08/20/22 0500 08/21/22 0313 08/21/22 0809 08/22/22 0742  NA 132*  --   --   --   --  131* 130*  --  132*  K 4.1  --   --   --   --  3.9 3.4*  --  2.9*  CL 92*  --   --   --   --  93* 92*  --  95*  CO2 28  --   --   --   --  28 25  --  29  GLUCOSE 179*  --   --   --   --  125* 160*  --  107*  BUN 52*  --   --   --   --  60* 72*  --  31*  CREATININE 5.35*  --   --   --   --  5.56* 6.18*  --  3.59*  CALCIUM 10.1  --   --   --   --  9.7 9.9  --  8.7*  AST 60*  --   --   --   --  65* 33  --  47*  ALT 33  --   --   --   --  28 25  --  28  ALKPHOS 75  --   --   --   --  62 70  --  71  BILITOT 2.2*  --   --   --   --  1.9* 2.1*  --  2.7*  ALBUMIN 2.1*  --   --   --   --  2.2* 2.0*  --  2.0*  MG 2.9*  --   --   --   --  2.9* 2.9*  --  2.1  CRP  --   --   --   --   --  18.0*  --   --   --   DDIMER  --   --  3.19*  --   --   --   --   --   --   LATICACIDVEN 2.1* 2.6*  --   --  2.0*  --   --   --   --   INR  --   --  7.4*  --   --  6.7* 3.6*  --  2.5*  TSH  --   --   --   --   --   --  3.171  --   --   HGBA1C  --   --   --  4.7*  --   --   --   --   --   AMMONIA  --  27  --   --   --   --   --  50* 31  BNP   --   --   --   --   --  705.0* 858.9*  --  496.7*   ID Labs Recent Labs  Lab 08/19/22 1515 08/19/22 1812 08/19/22 1855 08/19/22 2358 08/20/22 0500 08/21/22 0313 08/22/22 0742  WBC 4.7  --   --   --  4.2  4.2 5.2 5.1  PLT 26*  --  21*  --  22*  22* 17* 17*  CRP  --   --   --   --  18.0*  --   --   DDIMER  --   --  3.19*  --   --   --   --   LATICACIDVEN 2.1* 2.6*  --  2.0*  --   --   --   CREATININE 5.35*  --   --   --  5.56* 6.18* 3.59*    Micro Results Recent Results (from the past 240 hour(s))  Urine Culture     Status: Abnormal   Collection Time: 08/19/22  9:42 PM   Specimen: In/Out Cath Urine  Result Value Ref Range Status   Specimen Description IN/OUT CATH URINE  Final   Special Requests   Final    NONE Performed at [facility], [address], [place], [place] [address]    Culture (A)  Final    >=100,000 COLONIES/mL KLEBSIELLA PNEUMONIAE Confirmed Extended Spectrum Beta-Lactamase Producer (ESBL).  In bloodstream infections from ESBL organisms, carbapenems are preferred over piperacillin/tazobactam. They are shown to have a lower risk of mortality.    Report Status 08/22/2022 FINAL  Final   Organism ID, Bacteria KLEBSIELLA PNEUMONIAE (A)  Final      Susceptibility   Klebsiella pneumoniae - MIC*    AMPICILLIN >=32 RESISTANT Resistant     CEFAZOLIN >=64 RESISTANT Resistant     CEFEPIME >=32 RESISTANT Resistant     CEFTRIAXONE >=64 RESISTANT Resistant     CIPROFLOXACIN <=0.25 SENSITIVE Sensitive     GENTAMICIN <=1 SENSITIVE Sensitive     IMIPENEM <=0.25 SENSITIVE Sensitive     NITROFURANTOIN 128 RESISTANT Resistant     TRIMETH/SULFA >=320 RESISTANT Resistant     AMPICILLIN/SULBACTAM 16 INTERMEDIATE Intermediate     PIP/TAZO <=4 SENSITIVE Sensitive     * >=100,000 COLONIES/mL KLEBSIELLA PNEUMONIAE    Radiology Reports CT HEAD WO CONTRAST (5MM)  Result Date: 08/21/2022 CLINICAL DATA:  Delirium EXAM: CT HEAD WITHOUT CONTRAST TECHNIQUE: Contiguous axial  images were obtained from the base of the skull through the vertex without intravenous contrast. RADIATION DOSE REDUCTION: This exam was performed according to the departmental dose-optimization program which includes automated exposure control, adjustment of the mA and/or kV according to patient size and/or use of iterative reconstruction technique. COMPARISON:  CT head 08/19/2022. FINDINGS: Motion limited. Brain: No evidence of acute infarction, hemorrhage, hydrocephalus, extra-axial collection or mass lesion/mass effect. Patchy white matter hypoattenuation, nonspecific but compatible with chronic microvascular disease. Similar cerebral atrophy. Vascular: Calcific intracranial  atherosclerosis. Skull: No acute fracture. Sinuses/Orbits: Maxillary sinus retention cyst versus polyp with areas of mineralization. No acute orbital findings. Other: No mastoid effusions. IMPRESSION: Motion limited study without evidence of acute intracranial abnormality. Electronically Signed   By: Margaretha Sheffield M.D.   On: 08/21/2022 10:20   EEG adult  Result Date: 08/21/2022 Lora Havens, MD     08/21/2022 12:34 PM Patient Name: Dorothy Cooper MRN: [id number] Epilepsy Attending: Lora Havens Referring Physician/Provider: Thurnell Lose, MD Date: 08/21/2022 Duration: 26.25 mins Patient history: 72 year old female with altered mental status.  EEG to evaluate for seizure. Level of alertness:  lethargic AEDs during EEG study: None Technical aspects: This EEG study was done with scalp electrodes positioned according to the 10-20 International system of electrode placement. Electrical activity was reviewed with band pass filter of 1-'70Hz'$ , sensitivity of 7 uV/mm, display speed of 25m/sec with a '60Hz'$  notched filter applied as appropriate. EEG data were recorded continuously and digitally stored.  Video monitoring was available and reviewed as appropriate. Description: No clear posterior dominant rhythm was seen.  EEG showed  continuous generalized 2-3 hz delta slowing admixed with intermittent triphasic waves.  Hyperventilation and photic stimulation were not performed.   ABNORMALITY - Continuous slow, generalized -Triphasic waves, generalized IMPRESSION: This study is suggestive of severe diffuse encephalopathy, nonspecific to etiology but most likely related to toxic-metabolic causes.  No seizures or definite epileptiform discharges were seen throughout the recording. PLora Havens  DG Chest Port 1 View  Result Date: 08/20/2022 CLINICAL DATA:  72year old female with history of altered mental status. EXAM: PORTABLE CHEST 1 VIEW COMPARISON:  Chest x-ray 08/19/2022. FINDINGS: Lung volumes are low. No consolidative airspace disease. There is cephalization of the pulmonary vasculature and slight indistinctness of the interstitial markings suggestive of mild pulmonary edema. Trace right pleural effusion. No definite left pleural effusion. Mild cardiomegaly. The patient is rotated to the right on today's exam, resulting in distortion of the mediastinal contours and reduced diagnostic sensitivity and specificity for mediastinal pathology. Atherosclerotic calcifications in the thoracic aorta. IMPRESSION: 1. The appearance the chest suggests mild congestive heart failure, as above. 2. Aortic atherosclerosis. Electronically Signed   By: DVinnie LangtonM.D.   On: 08/20/2022 06:54   CT CHEST ABDOMEN PELVIS W CONTRAST  Result Date: 08/19/2022 CLINICAL DATA:  Sepsis. EXAM: CT CHEST, ABDOMEN, AND PELVIS WITH CONTRAST TECHNIQUE: Multidetector CT imaging of the chest, abdomen and pelvis was performed following the standard protocol during bolus administration of intravenous contrast. RADIATION DOSE REDUCTION: This exam was performed according to the departmental dose-optimization program which includes automated exposure control, adjustment of the mA and/or kV according to patient size and/or use of iterative reconstruction technique.  CONTRAST:  896mOMNIPAQUE IOHEXOL 300 MG/ML  SOLN COMPARISON:  Chest radiograph earlier today. FINDINGS: CT CHEST FINDINGS Cardiovascular: Aortic atherosclerosis and tortuosity without aneurysm or acute aortic findings. No obvious central pulmonary embolus on this exam not tailored for pulmonary arteries S mint. Borderline cardiomegaly. There are coronary artery calcifications. No pericardial effusion. Mediastinum/Nodes: No mediastinal adenopathy or mass. No hilar adenopathy. Decompressed esophagus. No visible thyroid nodule. Lungs/Pleura: Motion artifact through the bases. Dependent atelectasis in the right greater than left lower lobe. No confluent consolidation. Mild central bronchial thickening without endobronchial lesion. No significant pleural effusion. Musculoskeletal: Remote right proximal humerus fracture with posttraumatic deformity. Exaggerated thoracic kyphosis. The bones are diffusely under mineralized. Mild T6 compression deformity. No posterior cortex involvement. CT ABDOMEN PELVIS FINDINGS Hepatobiliary: Nodular hepatic contours consistent  with cirrhosis. There is no discrete focal hepatic lesion. Motion artifact through the liver limits assessment. Innumerable calcified gallstones. No definite pericholecystic inflammation. No biliary dilatation, although the common bile duct is poorly defined. Pancreas: No ductal dilatation or inflammation. Spleen: Splenomegaly with spleen measuring 15.1 x 6 x 13.4 cm (volume = 600 cm^3). Small subcapsular low-density posteriorly series 3, image 49, nonspecific. Adrenals/Urinary Tract: No adrenal nodule. Bilateral renal parenchymal atrophy. No hydronephrosis or focal renal lesion. Urinary bladder is obscured by streak artifact from left hip arthroplasty. 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 The stomach is decompressed. There is no small bowel obstruction or inflammation. Small to moderate volume of colonic stool without colonic inflammation. The appendix  is not visualized. Vascular/Lymphatic: Moderate to advanced aortic and branch atherosclerosis. No aortic aneurysm. There is no evidence of portal vein thrombosis, although phase of contrast limits portal vein assessment. Left upper quadrant collaterals with splenorenal shunting. Reproductive: Uterus primarily obscured by streak artifact from left hip arthroplasty. There is no obvious adnexal mass. Other: Minimal perihepatic ascites. Mild generalized edema of the subcutaneous and intra-abdominal fat. No free air. Surgical staples in the anterior abdominal wall. Musculoskeletal: Bones diffusely under mineralized. Marked compression deformity of L2 and L3 with buckling of the posterior cortex. Moderate L5 compression fracture. There is a fracture through the superior aspect of L1 vertebral body that is age indeterminate. Left hip arthroplasty. IMPRESSION: 1. Hepatic cirrhosis. Splenomegaly and left upper quadrant collaterals and splenorenal shunting. Minimal perihepatic ascites. 2. Cholelithiasis without definite pericholecystic inflammation. 3. Dependent atelectasis in both lungs.  No pneumonia. 4. Cardiomegaly.  Coronary artery calcifications. 5. Multiple thoracic and lumbar compression deformities. Marked compression deformity of L2 and L3 with buckling of the posterior cortex. Moderate L5 compression fracture. There is a fracture through the superior aspect of L1 vertebral body that is age indeterminate. Mild T6 compression fracture. Recommend correlation with focal tenderness. Aortic Atherosclerosis (ICD10-I70.0). Electronically Signed   By: Keith Rake M.D.   On: 08/19/2022 18:56   CT Head Wo Contrast  Result Date: 08/19/2022 CLINICAL DATA:  Mental status change, unknown cause. EXAM: CT HEAD WITHOUT CONTRAST TECHNIQUE: Contiguous axial images were obtained from the base of the skull through the vertex without intravenous contrast. RADIATION DOSE REDUCTION: This exam was performed according to the  departmental dose-optimization program which includes automated exposure control, adjustment of the mA and/or kV according to patient size and/or use of iterative reconstruction technique. COMPARISON:  None Available. FINDINGS: Brain: No evidence of acute infarction, hemorrhage, hydrocephalus, extra-axial collection or mass lesion/mass effect. Prominence of the ventricles and sulci secondary to mild cerebral volume loss. Patchy areas of low-attenuation presumed chronic microvascular ischemic changes. Vascular: No hyperdense vessel or unexpected calcification. Skull: Normal. Negative for fracture or focal lesion. Sinuses/Orbits: No acute finding. Other: None. IMPRESSION: 1.  No acute intracranial abnormality. 2. Mild cerebral volume loss and chronic microvascular ischemic changes of the white matter. Electronically Signed   By: Keane Police D.O.   On: 08/19/2022 16:48   DG Chest Portable 1 View  Result Date: 08/19/2022 CLINICAL DATA:  Altered mental status EXAM: PORTABLE CHEST 1 VIEW COMPARISON:  Chest radiograph 05/01/2022 FINDINGS: The heart is enlarged, unchanged. The upper mediastinal contours are stable. There is no focal consolidation or pulmonary edema. There is no pleural effusion or pneumothorax There is no acute osseous abnormality. IMPRESSION: Unchanged cardiomegaly.  No focal consolidation or pleural effusion. Electronically Signed   By: Valetta Mole M.D.   On: 08/19/2022 16:25

## 2022-08-23 DIAGNOSIS — G9341 Metabolic encephalopathy: Secondary | ICD-10-CM | POA: Diagnosis not present

## 2022-08-23 DIAGNOSIS — Z515 Encounter for palliative care: Secondary | ICD-10-CM | POA: Diagnosis not present

## 2022-08-23 MED ORDER — HYDROMORPHONE HCL 1 MG/ML IJ SOLN
0.2500 mg | Freq: Once | INTRAMUSCULAR | Status: DC
  Filled 2022-08-23: qty 0.5

## 2022-08-23 MED ORDER — HYDROMORPHONE HCL 1 MG/ML IJ SOLN
1.0000 mg | INTRAMUSCULAR | Status: DC | PRN
  Administered 2022-08-23 – 2022-08-25 (×9): 1 mg via INTRAVENOUS
  Filled 2022-08-23 (×11): qty 1

## 2022-08-23 MED ORDER — SALINE SPRAY 0.65 % NA SOLN
1.0000 | NASAL | Status: DC | PRN
  Filled 2022-08-23: qty 44

## 2022-08-23 MED ORDER — HYDROMORPHONE HCL 1 MG/ML IJ SOLN
0.5000 mg | INTRAMUSCULAR | Status: DC | PRN
  Administered 2022-08-23 (×3): 0.5 mg via INTRAVENOUS
  Filled 2022-08-23 (×3): qty 0.5

## 2022-08-23 NOTE — Progress Notes (Signed)
PROGRESS NOTE                                                                                                                                                                                                             Patient Demographics:    Dorothy Cooper, is a 72 y.o. female, DOB - August 31, 1950, PRF:[id number]  Outpatient Primary MD for the patient is Pcp, No    LOS - 3  Admit date - 08/19/2022    Chief Complaint  Patient presents with   Altered Mental Status       Brief Narrative (HPI from H&P)    72 y.o. 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 In the ER she was found to have toxic and metabolic encephalopathy, acute on chronic back and abdominal pain, UTI, acute on chronic thrombocytopenia with elevated INR.   Subjective:   No significant events overnight as discussed with granddaughter at bedside, she reports she was opening eyes intermittently, she did appear to be in some pain intermittently   Assessment  & Plan :   Acute metabolic and toxic encephalopathy.   Multifactorial due to combination of UTI, acute on chronic pain and being on narcotics, to a much lesser degree hyperammonemia from hepatic encephalopathy (ammonia level was at 50 ).  ESBL UTI -Treated with meropenem  T and L-spine compression fractures.  Age-indeterminate.    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  On TTS schedule.  Nephrology has been called.  Paroxysmal atrial fibrillation.  Mali vas 2 score > 3.    Hypotension.   Chr pain   COPD/asthma.   Dyslipidemia.    Chronic intermittent Vaginal bleed  DM type II.     Goals of care discussion -Rate of input greatly appreciated, patient with multiple comorbidities, and significant deterioration recently, had significant organ failures including liver  cirrhosis, ESRD, pancytopenia and progressive encephalopathy,  as well she is with significant chronic pain syndrome, requiring significant dose of narcotics leading to significant  encephalopathy, as well she does appear uncomfortable today due to pain, after discussion with palliative medicine decision has been made to proceed with comfort measures given the nature of her irreversible organ dysfunctions.  Discussed with granddaughter, patient has been opening her eyes intermittently and moaning intermittently, so we will increase her Dilaudid dose from 0.25 mg > 0.5 mg every 2 hours as needed  Lab Results  Component Value Date   HGBA1C 4.7 (L) 08/19/2022    CBG (last 3)  Recent Labs    08/21/22 2105 08/22/22 0306 08/22/22 0811  GLUCAP 181* 84 108*         Condition -comfort care  Family Communication  :    Granddaughter [name] at bedside today  Code Status : DNR  Consults  :  Renal, Haem, neuro, palliative  PUD Prophylaxis :     Procedures  :     CT Head - Non acute  CT Chest - 1. Hepatic cirrhosis. Splenomegaly and left upper quadrant collaterals and splenorenal shunting. Minimal perihepatic ascites. 2. Cholelithiasis without definite pericholecystic inflammation. 3. Dependent atelectasis in both lungs.  No pneumonia. 4. Cardiomegaly.  Coronary artery calcifications. 5. Multiple thoracic and lumbar compression deformities. Marked compression deformity of L2 and L3 with buckling of the posterior cortex. Moderate L5  compression fracture. There is a fracture through the superior aspect of L1 vertebral body that is age indeterminate. Mild T6 compression fracture.      Disposition Plan  :    Status is: Observation  DVT Prophylaxis  :       Lab Results  Component Value Date   PLT 17 (LL) 08/22/2022    Diet :  Diet Order             Diet regular Room service appropriate? Yes; Fluid consistency: Thin  Diet effective now                    Inpatient Medications  Scheduled Meds:  antiseptic oral rinse  15 mL Topical BID    HYDROmorphone (DILAUDID) injection  0.25 mg Intravenous Once   Continuous Infusions:  chlorproMAZINE (THORAZINE) 12.5 mg in sodium chloride 0.9 % 25 mL IVPB     dextrose 10 mL/hr at 08/22/22 1836   meropenem (MERREM) IV Stopped (08/22/22 1631)   methocarbamol (ROBAXIN) IV     PRN Meds:.acetaminophen **OR** acetaminophen, chlorproMAZINE (THORAZINE) 12.5 mg in sodium chloride 0.9 % 25 mL IVPB, diphenhydrAMINE, glycopyrrolate **OR** glycopyrrolate **OR** glycopyrrolate, haloperidol **OR** haloperidol **OR** haloperidol lactate, HYDROmorphone (DILAUDID) injection, LORazepam **OR** LORazepam **OR** LORazepam, methocarbamol (ROBAXIN) IV, ondansetron **OR** ondansetron (ZOFRAN) IV, polyvinyl alcohol, sodium chloride     Phillips Climes M.D on 08/23/2022 at 11:39 AM  To page go to [email or web address]   [facility] -  Office  [phone]  See all Orders from today for further details    Objective:   Vitals:   08/22/22 2345 08/23/22 0245 08/23/22 0456 08/23/22 0953  BP:    (!) 91/47  Pulse:    69  Resp: '16 15 13 15  '$ Temp:    98.4 F (36.9 C)  TempSrc:    Axillary  SpO2:      Weight:      Height:        Wt Readings from Last 3 Encounters:  08/20/22 58.2 kg  05/01/22 72.6 kg  Intake/Output Summary (Last 24 hours) at 08/23/2022 1139 Last data filed at 08/23/2022 0400 Gross per 24 hour  Intake 292.72 ml  Output --  Net 292.72 ml     Physical  Exam  Attendant, sleeping comfortably, no apparent distress Diminished air entry at the bases Bowel sounds present       Data Review:    CBC Recent Labs  Lab 08/19/22 1515 08/19/22 1855 08/20/22 0500 08/21/22 0313 08/22/22 0742  WBC 4.7  --  4.2  4.2 5.2 5.1  HGB 9.1*  --  7.9*  7.7* 8.2* 7.7*  HCT 27.4*  --  24.0*  23.9* 24.1* 23.1*  PLT 26* 21* 22*  22* 17* 17*  MCV 106.6*  --  107.1*  105.8* 101.7* 101.8*  MCH 35.4*  --  35.3*  34.1* 34.6* 33.9  MCHC 33.2  --  32.9  32.2 34.0 33.3  RDW 17.0*  --  17.0*  16.9* 16.6* 16.7*  LYMPHSABS 0.3*  --  0.2* 0.4* 0.2*  MONOABS 0.3  --  0.4 0.5 0.3  EOSABS 0.0  --  0.0 0.0 0.0  BASOSABS 0.0  --  0.0 0.0 0.0    Electrolytes Recent Labs  Lab 08/19/22 1515 08/19/22 1812 08/19/22 1855 08/19/22 2237 08/19/22 2358 08/20/22 0500 08/21/22 0313 08/21/22 0809 08/22/22 0742  NA 132*  --   --   --   --  131* 130*  --  132*  K 4.1  --   --   --   --  3.9 3.4*  --  2.9*  CL 92*  --   --   --   --  93* 92*  --  95*  CO2 28  --   --   --   --  28 25  --  29  GLUCOSE 179*  --   --   --   --  125* 160*  --  107*  BUN 52*  --   --   --   --  60* 72*  --  31*  CREATININE 5.35*  --   --   --   --  5.56* 6.18*  --  3.59*  CALCIUM 10.1  --   --   --   --  9.7 9.9  --  8.7*  AST 60*  --   --   --   --  65* 33  --  47*  ALT 33  --   --   --   --  28 25  --  28  ALKPHOS 75  --   --   --   --  62 70  --  71  BILITOT 2.2*  --   --   --   --  1.9* 2.1*  --  2.7*  ALBUMIN 2.1*  --   --   --   --  2.2* 2.0*  --  2.0*  MG 2.9*  --   --   --   --  2.9* 2.9*  --  2.1  CRP  --   --   --   --   --  18.0*  --   --   --   DDIMER  --   --  3.19*  --   --   --   --   --   --   LATICACIDVEN 2.1* 2.6*  --   --  2.0*  --   --   --   --   INR  --   --  7.4*  --   --  6.7* 3.6*  --  2.5*  TSH  --   --   --   --   --   --  3.171  --   --   HGBA1C  --   --   --  4.7*  --   --   --   --   --   AMMONIA  --  27  --   --   --   --   --  50* 31  BNP  --    --   --   --   --  705.0* 858.9*  --  496.7*   ID Labs Recent Labs  Lab 08/19/22 1515 08/19/22 1812 08/19/22 1855 08/19/22 2358 08/20/22 0500 08/21/22 0313 08/22/22 0742  WBC 4.7  --   --   --  4.2  4.2 5.2 5.1  PLT 26*  --  21*  --  22*  22* 17* 17*  CRP  --   --   --   --  18.0*  --   --   DDIMER  --   --  3.19*  --   --   --   --   LATICACIDVEN 2.1* 2.6*  --  2.0*  --   --   --   CREATININE 5.35*  --   --   --  5.56* 6.18* 3.59*    Micro Results Recent Results (from the past 240 hour(s))  Urine Culture     Status: Abnormal   Collection Time: 08/19/22  9:42 PM   Specimen: In/Out Cath Urine  Result Value Ref Range Status   Specimen Description IN/OUT CATH URINE  Final   Special Requests   Final    NONE Performed at [facility], [address] [place], [place] [address]    Culture (A)  Final    >=100,000 COLONIES/mL KLEBSIELLA PNEUMONIAE Confirmed Extended Spectrum Beta-Lactamase Producer (ESBL).  In bloodstream infections from ESBL organisms, carbapenems are preferred over piperacillin/tazobactam. They are shown to have a lower risk of mortality.    Report Status 08/22/2022 FINAL  Final   Organism ID, Bacteria KLEBSIELLA PNEUMONIAE (A)  Final      Susceptibility   Klebsiella pneumoniae - MIC*    AMPICILLIN >=32 RESISTANT Resistant     CEFAZOLIN >=64 RESISTANT Resistant     CEFEPIME >=32 RESISTANT Resistant     CEFTRIAXONE >=64 RESISTANT Resistant     CIPROFLOXACIN <=0.25 SENSITIVE Sensitive     GENTAMICIN <=1 SENSITIVE Sensitive     IMIPENEM <=0.25 SENSITIVE Sensitive     NITROFURANTOIN 128 RESISTANT Resistant     TRIMETH/SULFA >=320 RESISTANT Resistant     AMPICILLIN/SULBACTAM 16 INTERMEDIATE Intermediate     PIP/TAZO <=4 SENSITIVE Sensitive     * >=100,000 COLONIES/mL KLEBSIELLA PNEUMONIAE    Radiology Reports CT HEAD WO CONTRAST (5MM)  Result Date: 08/21/2022 CLINICAL DATA:  Delirium EXAM: CT HEAD WITHOUT CONTRAST TECHNIQUE: Contiguous axial  images were obtained from the base of the skull through the vertex without intravenous contrast. RADIATION DOSE REDUCTION: This exam was performed according to the departmental dose-optimization program which includes automated exposure control, adjustment of the mA and/or kV according to patient size and/or use of iterative reconstruction technique. COMPARISON:  CT head 08/19/2022. FINDINGS: Motion limited. Brain: No evidence of acute infarction, hemorrhage, hydrocephalus, extra-axial collection or mass lesion/mass effect. Patchy white matter hypoattenuation, nonspecific but compatible with chronic microvascular disease. Similar cerebral atrophy. Vascular: Calcific intracranial  atherosclerosis. Skull: No acute fracture. Sinuses/Orbits: Maxillary sinus retention cyst versus polyp with areas of mineralization. No acute orbital findings. Other: No mastoid effusions. IMPRESSION: Motion limited study without evidence of acute intracranial abnormality. Electronically Signed   By: Margaretha Sheffield M.D.   On: 08/21/2022 10:20   EEG adult  Result Date: 08/21/2022 Lora Havens, MD     08/21/2022 12:34 PM Patient Name: Kalisha Keadle MRN: [id number] Epilepsy Attending: Lora Havens Referring Physician/Provider: Thurnell Lose, MD Date: 08/21/2022 Duration: 26.25 mins Patient history: 72 year old female with altered mental status.  EEG to evaluate for seizure. Level of alertness:  lethargic AEDs during EEG study: None Technical aspects: This EEG study was done with scalp electrodes positioned according to the 10-20 International system of electrode placement. Electrical activity was reviewed with band pass filter of 1-'70Hz'$ , sensitivity of 7 uV/mm, display speed of 54m/sec with a '60Hz'$  notched filter applied as appropriate. EEG data were recorded continuously and digitally stored.  Video monitoring was available and reviewed as appropriate. Description: No clear posterior dominant rhythm was seen.  EEG showed  continuous generalized 2-3 hz delta slowing admixed with intermittent triphasic waves.  Hyperventilation and photic stimulation were not performed.   ABNORMALITY - Continuous slow, generalized -Triphasic waves, generalized IMPRESSION: This study is suggestive of severe diffuse encephalopathy, nonspecific to etiology but most likely related to toxic-metabolic causes.  No seizures or definite epileptiform discharges were seen throughout the recording. PLora Havens  DG Chest Port 1 View  Result Date: 08/20/2022 CLINICAL DATA:  72year old female with history of altered mental status. EXAM: PORTABLE CHEST 1 VIEW COMPARISON:  Chest x-ray 08/19/2022. FINDINGS: Lung volumes are low. No consolidative airspace disease. There is cephalization of the pulmonary vasculature and slight indistinctness of the interstitial markings suggestive of mild pulmonary edema. Trace right pleural effusion. No definite left pleural effusion. Mild cardiomegaly. The patient is rotated to the right on today's exam, resulting in distortion of the mediastinal contours and reduced diagnostic sensitivity and specificity for mediastinal pathology. Atherosclerotic calcifications in the thoracic aorta. IMPRESSION: 1. The appearance the chest suggests mild congestive heart failure, as above. 2. Aortic atherosclerosis. Electronically Signed   By: DVinnie LangtonM.D.   On: 08/20/2022 06:54   CT CHEST ABDOMEN PELVIS W CONTRAST  Result Date: 08/19/2022 CLINICAL DATA:  Sepsis. EXAM: CT CHEST, ABDOMEN, AND PELVIS WITH CONTRAST TECHNIQUE: Multidetector CT imaging of the chest, abdomen and pelvis was performed following the standard protocol during bolus administration of intravenous contrast. RADIATION DOSE REDUCTION: This exam was performed according to the departmental dose-optimization program which includes automated exposure control, adjustment of the mA and/or kV according to patient size and/or use of iterative reconstruction technique.  CONTRAST:  827mOMNIPAQUE IOHEXOL 300 MG/ML  SOLN COMPARISON:  Chest radiograph earlier today. FINDINGS: CT CHEST FINDINGS Cardiovascular: Aortic atherosclerosis and tortuosity without aneurysm or acute aortic findings. No obvious central pulmonary embolus on this exam not tailored for pulmonary arteries S mint. Borderline cardiomegaly. There are coronary artery calcifications. No pericardial effusion. Mediastinum/Nodes: No mediastinal adenopathy or mass. No hilar adenopathy. Decompressed esophagus. No visible thyroid nodule. Lungs/Pleura: Motion artifact through the bases. Dependent atelectasis in the right greater than left lower lobe. No confluent consolidation. Mild central bronchial thickening without endobronchial lesion. No significant pleural effusion. Musculoskeletal: Remote right proximal humerus fracture with posttraumatic deformity. Exaggerated thoracic kyphosis. The bones are diffusely under mineralized. Mild T6 compression deformity. No posterior cortex involvement. CT ABDOMEN PELVIS FINDINGS Hepatobiliary: Nodular hepatic contours consistent  with cirrhosis. There is no discrete focal hepatic lesion. Motion artifact through the liver limits assessment. Innumerable calcified gallstones. No definite pericholecystic inflammation. No biliary dilatation, although the common bile duct is poorly defined. Pancreas: No ductal dilatation or inflammation. Spleen: Splenomegaly with spleen measuring 15.1 x 6 x 13.4 cm (volume = 600 cm^3). Small subcapsular low-density posteriorly series 3, image 49, nonspecific. Adrenals/Urinary Tract: No adrenal nodule. Bilateral renal parenchymal atrophy. No hydronephrosis or focal renal lesion. Urinary bladder is obscured by streak artifact from left hip arthroplasty. 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 The stomach is decompressed. There is no small bowel obstruction or inflammation. Small to moderate volume of colonic stool without colonic inflammation. The appendix  is not visualized. Vascular/Lymphatic: Moderate to advanced aortic and branch atherosclerosis. No aortic aneurysm. There is no evidence of portal vein thrombosis, although phase of contrast limits portal vein assessment. Left upper quadrant collaterals with splenorenal shunting. Reproductive: Uterus primarily obscured by streak artifact from left hip arthroplasty. There is no obvious adnexal mass. Other: Minimal perihepatic ascites. Mild generalized edema of the subcutaneous and intra-abdominal fat. No free air. Surgical staples in the anterior abdominal wall. Musculoskeletal: Bones diffusely under mineralized. Marked compression deformity of L2 and L3 with buckling of the posterior cortex. Moderate L5 compression fracture. There is a fracture through the superior aspect of L1 vertebral body that is age indeterminate. Left hip arthroplasty. IMPRESSION: 1. Hepatic cirrhosis. Splenomegaly and left upper quadrant collaterals and splenorenal shunting. Minimal perihepatic ascites. 2. Cholelithiasis without definite pericholecystic inflammation. 3. Dependent atelectasis in both lungs.  No pneumonia. 4. Cardiomegaly.  Coronary artery calcifications. 5. Multiple thoracic and lumbar compression deformities. Marked compression deformity of L2 and L3 with buckling of the posterior cortex. Moderate L5 compression fracture. There is a fracture through the superior aspect of L1 vertebral body that is age indeterminate. Mild T6 compression fracture. Recommend correlation with focal tenderness. Aortic Atherosclerosis (ICD10-I70.0). Electronically Signed   By: Keith Rake M.D.   On: 08/19/2022 18:56   CT Head Wo Contrast  Result Date: 08/19/2022 CLINICAL DATA:  Mental status change, unknown cause. EXAM: CT HEAD WITHOUT CONTRAST TECHNIQUE: Contiguous axial images were obtained from the base of the skull through the vertex without intravenous contrast. RADIATION DOSE REDUCTION: This exam was performed according to the  departmental dose-optimization program which includes automated exposure control, adjustment of the mA and/or kV according to patient size and/or use of iterative reconstruction technique. COMPARISON:  None Available. FINDINGS: Brain: No evidence of acute infarction, hemorrhage, hydrocephalus, extra-axial collection or mass lesion/mass effect. Prominence of the ventricles and sulci secondary to mild cerebral volume loss. Patchy areas of low-attenuation presumed chronic microvascular ischemic changes. Vascular: No hyperdense vessel or unexpected calcification. Skull: Normal. Negative for fracture or focal lesion. Sinuses/Orbits: No acute finding. Other: None. IMPRESSION: 1.  No acute intracranial abnormality. 2. Mild cerebral volume loss and chronic microvascular ischemic changes of the white matter. Electronically Signed   By: Keane Police D.O.   On: 08/19/2022 16:48   DG Chest Portable 1 View  Result Date: 08/19/2022 CLINICAL DATA:  Altered mental status EXAM: PORTABLE CHEST 1 VIEW COMPARISON:  Chest radiograph 05/01/2022 FINDINGS: The heart is enlarged, unchanged. The upper mediastinal contours are stable. There is no focal consolidation or pulmonary edema. There is no pleural effusion or pneumothorax There is no acute osseous abnormality. IMPRESSION: Unchanged cardiomegaly.  No focal consolidation or pleural effusion. Electronically Signed   By: Valetta Mole M.D.   On: 08/19/2022 16:25

## 2022-08-23 NOTE — Progress Notes (Signed)
Daily Progress Note   Patient Name: Dorothy Cooper       Date: 08/23/2022 DOB: 1950-03-22  Age: 72 y.o. MRN#: [id number] Attending Physician: Albertine Patricia, MD Primary Care Physician: Pcp, No Admit Date: 08/19/2022  Reason for Consultation/Follow-up: Establishing goals of care  Subjective: Chart review performed including progress notes, labs, imaging.  Discussed with RN.  Patient assessed at the bedside.  She has several family members visiting.  Discussed patient's current pain management with as needed medications.  Granddaughter [name] shares that she has had her best night so far, having slept 4 hours straight.  She has had no pain since her last dose of Dilaudid about 3 hours ago.  She does still seem to have some moments of pain/grimacing between her doses.  We discussed options including increased frequency of pain medicines, increased dosage, or initiation of continuous infusion.  We will continue with increased dosage for now.  Followed up on family's goals for residential hospice and they share with me they continue working in coordination with TOC on this.  This remains their hope.  They would also like to make sure she is cleaned up and looks like herself.  Assisted with obtaining materials for a shave.  All questions and concerns addressed. Encouraged to call with questions and/or concerns. PMT card provided.  Length of Stay: 3  Physical Exam Vitals and nursing note reviewed.  Constitutional:      General: She is not in acute distress.    Appearance: She is cachectic. She is ill-appearing.  Pulmonary:     Effort: No respiratory distress.  Skin:    General: Skin is warm and dry.  Neurological:     Mental Status: She is lethargic.     Motor: Weakness present.   Psychiatric:        Speech: Speech is slurred.             Vital Signs: BP (!) 91/47 (BP Location: Right Arm)   Pulse 69   Temp 98.4 F (36.9 C) (Axillary)   Resp 15   Ht '5\' 2"'$  (1.575 m)   Wt 58.2 kg   LMP  (LMP Unknown)   SpO2 99%   BMI 23.47 kg/m  SpO2: SpO2: 99 % O2 Device: O2 Device: Nasal Cannula O2 Flow Rate: O2 Flow Rate (L/min): 2 L/min  Palliative Assessment/Data: PPS 10-20%    Palliative Care Assessment & Plan   Patient Profile: 72 y.o. female  with past medical history of end-stage renal disease on hemodialysis Tuesday/Thursday/ Saturday, thrombocytopenia, cirrhosis  unclear reason, diastolic CHF, chronic anemia, asthma, COPD, DM2, glaucoma, HLD, HTN pancytopenia, on eliquis for A.fib, migraine headaches presented to ED on 08/19/22 from [facility] with AMS. Patient was admitted on 08/19/2022 with acute metabolic encephalopathy, UTI, cirrhosis, hypotension, thrombocytopenia, cirrhosis, L and T-spine compression fracture. Neurosurgery states patient is not a surgical candidate; only option is TLSO brace.   Assessment: Principal Problem:   Acute metabolic encephalopathy Active Problems:   UTI (urinary tract infection)   Cirrhosis (HCC)   DM2 (diabetes mellitus, type 2) (HCC)   Paroxysmal atrial fibrillation (HCC)   Elevated INR   Chronic diastolic CHF (congestive heart failure) (HCC)   Hypotension   ESRD (end stage renal disease) (HCC)   Thrombocytopenia (HCC)   Compression fracture of lumbar vertebra (HCC)   Pressure injury of skin   Other pancytopenia ([place])   Terminal care  Recommendations/Plan: Continue full comfort measures Continue DNR/DNI as previously documented Family continues to work with TOC on potential transfer to residential hospice closer to home Continue unrestricted visitation  Nursing to provide frequent assessments and administer PRN medications as clinically necessary to ensure EOL comfort (family is not yet ready for drip or  scheduled Dilaudid) PMT will continue to follow and support holistically   Prognosis:  < 2 weeks  Discharge Planning: Hospice facility vs hospital death  Care plan was discussed with RN, patient's family  Total time: I spent 35 minutes in the care of the patient today in the above activities and documenting the encounter.   [name], PA-C Palliative Medicine Team Team phone # [phone]  Thank you for allowing the Palliative Medicine Team to assist in the care of this patient. Please utilize secure chat with additional questions, if there is no response within 30 minutes please call the above phone number.  Palliative Medicine Team providers are available by phone from 7am to 7pm daily and can be reached through the team cell phone.  Should this patient require assistance outside of these hours, please call the patient's attending physician.

## 2022-08-23 NOTE — Progress Notes (Addendum)
Patient Dorothy Cooper      DOB: 1950-11-16      BJS:[id number]      Palliative Medicine Team    Subjective: Bedside symptom check completed. Three family members bedside at time of visit.    Physical exam: Patient resting in bed with eyes closed at time of visit. Breathing even and non-labored, no excessive secretions noted. Patient with occasional furrowing of brow. Family bedside endorses that she has been showing nonverbal signs of breakthrough pain with current PRN regimen. One time additional dose of analgesic ordered to bridge, PA to assess and adjust.    Assessment and plan: At this time patient not stabilized for transfer to residential hospice, still experiencing significant pain even at rest without any movement. Plan to continue to check in with family throughout day today to reassess interventions for comfort. PA Dorothy Cooper to follow up this morning. Will continue to follow for any changes or advances.    Addendum: Family wanting to hold off on additional PRN dose offered by nursing and increase future PRN dosage. Attending MD reflected those changes in order.   Thank you for allowing the Palliative Medicine Team to assist in the care of this patient.     Damian Leavell, MSN, RN Palliative Medicine Team Team Phone: [phone]  This phone is monitored 7a-7p, please reach out to attending physician outside of these hours for urgent needs.

## 2022-08-23 NOTE — TOC Progression Note (Addendum)
Transition of Care [facility]) - Progression Note    Patient Details  Name: Dorothy Cooper MRN: [id number] Date of Birth: 08-Mar-1950  Transition of Care [facility]) CM/SW [place], [place] Phone Number: 08/23/2022, 2:19 PM  Clinical Narrative:    CSW was notified by family that they have chosen [facility]. CSW confirmed with medical team that pt is medically stable to transfer at this time. [facility] does not have a bed today, but they will review and follow. TOC will continue to follow for DC needs.  4:00 CSW received a call from pt's granddaughter who wanted to speak again about pt going to [facility]. CSW advised that at the initial assessment family was made aware of the cost associated with transport, as it was outside the 50 mile radius. CSW followed up with billing at ambulance transport after initial assessment and they confirmed that to transport there, it would be an out of pocket expense at approximately 2000 dollars. Family stated they were unable to afford this. CSW also followed up with [facility] to determine if they could assist, as pt was moved here from [facility] emergently from her SNF. [facility] stated that due to the amount of care she would need on transport, they were unable to assist with the resources they had. CSW attempted to find a hospice facility closer to [facility], but still in the 41 mile radius, [facility] and [facility] were suggested. Family reviewed and chose [facility]. Granddaughter upset that a referral had not been sent to  [facility], per initial conversation, it had been determined there was not a feasible way to get pt there, so other options were explored. At granddaughters request, CSW has sent referral to [facility] and requested a follow up to see if they have any resources to help get the pt to the facility, they stated they would follow up with CSW tomorrow. At this time [facility] full and has a wait list.  Barriers  discussed with granddaughter include Medicare not covering outside a 50 mile radius, and per ambulance billing, they would not cover up to 50 miles and family could pay the remaining.  Granddaughter noted she was told pt would need to transfer if she was medically able or they would be billed. CSW educated that there is not a bed available at [facility] today, the latter only applies if there is a bed available and pt or family chooses to stay. Granddaughter stating she will call Medicare, and see if they can assist her with transport. TOC will continue to follow and attempt to assist family with discharge needs.  Expected Discharge Plan: [place] Barriers to Discharge: Continued Medical Work up  Expected Discharge Plan and Services Expected Discharge Plan: [place] Choice: [place] arrangements for the past 2 months: [place]                                       Social Determinants of Health (SDOH) Interventions    Readmission Risk Interventions     No data to display

## 2022-08-23 NOTE — Progress Notes (Signed)
[facility]) Hospital Liaison Note  Referral received for patient/family interest in [facility]. Chart under review by [facility] physician.   Hospice eligibility pending.   Unfortunately, [facility] is unable to offer a bed today. [facility] liaison will continue to follow and update when a bed becomes available.   Please call with any questions or concerns. Thank you  Roselee Nova, [facility] Liaison [phone]

## 2022-08-24 DIAGNOSIS — G9341 Metabolic encephalopathy: Secondary | ICD-10-CM | POA: Diagnosis not present

## 2022-08-24 DIAGNOSIS — K7460 Unspecified cirrhosis of liver: Secondary | ICD-10-CM | POA: Diagnosis not present

## 2022-08-24 NOTE — Care Management Important Message (Signed)
Important Message  Patient Details  Name: Dorothy Cooper MRN: [id number] Date of Birth: 03-18-1950   Medicare Important Message Given:  Yes     Rhegan Trunnell Montine Circle 08/24/2022, 10:49 AM

## 2022-08-24 NOTE — Care Management Important Message (Signed)
Important Message  Patient Details  Name: Keyani Rigdon MRN: [id number] Date of Birth: 19-Jun-1950   Medicare Important Message Given:  Yes     Orbie Pyo 08/24/2022, 2:54 PM

## 2022-08-24 NOTE — Progress Notes (Signed)
PROGRESS NOTE                                                                                                                                                                                                             Patient Demographics:    Dorothy Cooper, is a 72 y.o. female, DOB - 1950-01-08, FHL:[id number]  Outpatient Primary MD for the patient is Pcp, No    LOS - 4  Admit date - 08/19/2022    Chief Complaint  Patient presents with   Altered Mental Status       Brief Narrative (HPI from H&P)    72 y.o. 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 In the ER she was found to have toxic and metabolic encephalopathy, acute on chronic back and abdominal pain, UTI, acute on chronic thrombocytopenia with elevated INR.   Subjective:   Appears more comfortable on increased dose Dilaudid, multiple family members at bedside, I have discussed with granddaughter [name].   Assessment  & Plan :   Acute metabolic and toxic encephalopathy.   Multifactorial due to combination of UTI, acute on chronic pain and being on narcotics, to a much lesser degree hyperammonemia from hepatic encephalopathy (ammonia level was at 50 ).  ESBL UTI -Treated with meropenem  T and L-spine compression fractures.  Age-indeterminate.    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  On TTS schedule.  Nephrology has been called.  Paroxysmal atrial fibrillation.  Mali vas 2 score > 3.    Hypotension.   Chr pain   COPD/asthma.   Dyslipidemia.    Chronic intermittent Vaginal bleed  DM type II.     Goals of care discussion -Rate of input greatly appreciated, patient with multiple comorbidities, and significant deterioration recently, had significant organ failures including liver cirrhosis, ESRD, pancytopenia and progressive encephalopathy,  as  well she is with significant chronic pain syndrome, requiring significant dose of narcotics leading to significant  encephalopathy, as well she does appear uncomfortable  due to pain, after discussion with palliative medicine decision has been made to proceed with comfort measures given the nature of her irreversible organ dysfunctions.  Patient is currently on full comfort measures, she appears to be more comfortable on current regimen with increased dose of Dilaudid.  Lab Results  Component Value Date   HGBA1C 4.7 (L) 08/19/2022    CBG (last 3)  Recent Labs    08/21/22 2105 08/22/22 0306 08/22/22 0811  GLUCAP 181* 84 108*         Condition -comfort care  Family Communication  :    Granddaughter [name] at bedside today  Code Status : DNR  Consults  :  Renal, Haem, neuro, palliative  PUD Prophylaxis :     Procedures  :     CT Head - Non acute  CT Chest - 1. Hepatic cirrhosis. Splenomegaly and left upper quadrant collaterals and splenorenal shunting. Minimal perihepatic ascites. 2. Cholelithiasis without definite pericholecystic inflammation. 3. Dependent atelectasis in both lungs.  No pneumonia. 4. Cardiomegaly.  Coronary artery calcifications. 5. Multiple thoracic and lumbar compression deformities. Marked compression deformity of L2 and L3 with buckling of the posterior cortex. Moderate L5 compression fracture. There is a fracture through the superior aspect of L1 vertebral body that is age  indeterminate. Mild T6 compression fracture.      Disposition Plan  :    Status is: Observation  DVT Prophylaxis  :       Lab Results  Component Value Date   PLT 17 (LL) 08/22/2022    Diet :  Diet Order             Diet regular Room service appropriate? Yes; Fluid consistency: Thin  Diet effective now                    Inpatient Medications  Scheduled Meds:  antiseptic oral rinse  15 mL Topical BID   Continuous Infusions:  chlorproMAZINE (THORAZINE) 12.5 mg in sodium chloride 0.9 % 25 mL IVPB     dextrose 10 mL/hr at 08/24/22 1335   meropenem (MERREM) IV 500 mg (08/23/22 1506)   methocarbamol (ROBAXIN) IV     PRN Meds:.acetaminophen **OR** acetaminophen, chlorproMAZINE (THORAZINE) 12.5 mg in sodium chloride 0.9 % 25 mL IVPB, diphenhydrAMINE, glycopyrrolate **OR** glycopyrrolate **OR** glycopyrrolate, haloperidol **OR** haloperidol **OR** haloperidol lactate, HYDROmorphone (DILAUDID) injection, LORazepam **OR** LORazepam **OR** LORazepam, methocarbamol (ROBAXIN) IV, ondansetron **OR** ondansetron (ZOFRAN) IV, polyvinyl alcohol, sodium chloride     Phillips Climes M.D on 08/24/2022 at 2:53 PM  To page go to [email or web address]   [facility] -  Office  [phone]  See all Orders from today for further details    Objective:   Vitals:   08/24/22 1134 08/24/22 1139 08/24/22 1257 08/24/22 1300  BP:  (!) 90/53    Pulse:  61    Resp: '12 11 15 '$ (!) 9  Temp:      TempSrc:      SpO2:  100%    Weight:      Height:        Wt Readings from Last 3 Encounters:  08/20/22 58.2 kg  05/01/22 72.6 kg    No intake or output data in the 24 hours ending 08/24/22 1453    Physical Exam  Patient is obtundent, sleeping comfortably, does not respond to verbal stimuli  Fair air entry bilaterally, no wheezing Regular rate and rhythm No cyanosis       Data Review:    CBC Recent Labs  Lab 08/19/22 1515 08/19/22 1855 08/20/22 0500 08/21/22 0313  08/22/22 0742  WBC 4.7  --  4.2  4.2 5.2 5.1  HGB 9.1*  --  7.9*  7.7* 8.2* 7.7*  HCT 27.4*  --  24.0*  23.9* 24.1* 23.1*  PLT 26* 21* 22*  22* 17* 17*  MCV 106.6*  --  107.1*  105.8* 101.7* 101.8*  MCH 35.4*  --  35.3*  34.1* 34.6* 33.9  MCHC 33.2  --  32.9  32.2 34.0 33.3  RDW 17.0*  --  17.0*  16.9* 16.6* 16.7*  LYMPHSABS 0.3*  --  0.2* 0.4* 0.2*  MONOABS 0.3  --  0.4 0.5 0.3  EOSABS 0.0  --  0.0 0.0 0.0  BASOSABS 0.0  --  0.0 0.0 0.0    Electrolytes Recent Labs  Lab 08/19/22 1515 08/19/22 1812 08/19/22 1855 08/19/22 2237 08/19/22 2358 08/20/22 0500 08/21/22 0313 08/21/22 0809 08/22/22 0742  NA 132*  --   --   --   --  131* 130*  --  132*  K 4.1  --   --   --   --  3.9 3.4*  --  2.9*  CL 92*  --   --   --   --  93* 92*  --  95*  CO2 28  --   --   --   --  28 25  --  29  GLUCOSE 179*  --   --   --   --  125* 160*  --  107*  BUN 52*  --   --   --   --  60* 72*  --  31*  CREATININE 5.35*  --   --   --   --  5.56* 6.18*  --  3.59*  CALCIUM 10.1  --   --   --   --  9.7 9.9  --  8.7*  AST 60*  --   --   --   --  65* 33  --  47*  ALT 33  --   --   --   --  28 25  --  28  ALKPHOS 75  --   --   --   --  62 70  --  71  BILITOT 2.2*  --   --   --   --  1.9* 2.1*  --  2.7*  ALBUMIN 2.1*  --   --   --   --  2.2* 2.0*  --  2.0*  MG 2.9*  --   --   --   --  2.9* 2.9*  --  2.1  CRP  --   --   --   --   --  18.0*  --   --   --   DDIMER  --   --  3.19*  --   --   --   --   --   --   LATICACIDVEN 2.1* 2.6*  --   --  2.0*  --   --   --   --   INR  --   --  7.4*  --   --  6.7* 3.6*  --  2.5*  TSH  --   --   --   --   --   --  3.171  --   --  HGBA1C  --   --   --  4.7*  --   --   --   --   --   AMMONIA  --  27  --   --   --   --   --  50* 31  BNP  --   --   --   --   --  705.0* 858.9*  --  496.7*   ID Labs Recent Labs  Lab 08/19/22 1515 08/19/22 1812 08/19/22 1855 08/19/22 2358 08/20/22 0500 08/21/22 0313 08/22/22 0742  WBC 4.7  --   --   --  4.2  4.2 5.2 5.1  PLT  26*  --  21*  --  22*  22* 17* 17*  CRP  --   --   --   --  18.0*  --   --   DDIMER  --   --  3.19*  --   --   --   --   LATICACIDVEN 2.1* 2.6*  --  2.0*  --   --   --   CREATININE 5.35*  --   --   --  5.56* 6.18* 3.59*    Micro Results Recent Results (from the past 240 hour(s))  Urine Culture     Status: Abnormal   Collection Time: 08/19/22  9:42 PM   Specimen: In/Out Cath Urine  Result Value Ref Range Status   Specimen Description IN/OUT CATH URINE  Final   Special Requests   Final    NONE Performed at [facility], [address] [place], [place] [address]    Culture (A)  Final    >=100,000 COLONIES/mL KLEBSIELLA PNEUMONIAE Confirmed Extended Spectrum Beta-Lactamase Producer (ESBL).  In bloodstream infections from ESBL organisms, carbapenems are preferred over piperacillin/tazobactam. They are shown to have a lower risk of mortality.    Report Status 08/22/2022 FINAL  Final   Organism ID, Bacteria KLEBSIELLA PNEUMONIAE (A)  Final      Susceptibility   Klebsiella pneumoniae - MIC*    AMPICILLIN >=32 RESISTANT Resistant     CEFAZOLIN >=64 RESISTANT Resistant     CEFEPIME >=32 RESISTANT Resistant     CEFTRIAXONE >=64 RESISTANT Resistant     CIPROFLOXACIN <=0.25 SENSITIVE Sensitive     GENTAMICIN <=1 SENSITIVE Sensitive     IMIPENEM <=0.25 SENSITIVE Sensitive     NITROFURANTOIN 128 RESISTANT Resistant     TRIMETH/SULFA >=320 RESISTANT Resistant     AMPICILLIN/SULBACTAM 16 INTERMEDIATE Intermediate     PIP/TAZO <=4 SENSITIVE Sensitive     * >=100,000 COLONIES/mL KLEBSIELLA PNEUMONIAE    Radiology Reports CT HEAD WO CONTRAST (5MM)  Result Date: 08/21/2022 CLINICAL DATA:  Delirium EXAM: CT HEAD WITHOUT CONTRAST TECHNIQUE: Contiguous axial images were obtained from the base of the skull through the vertex without intravenous contrast. RADIATION DOSE REDUCTION: This exam was performed according to the departmental dose-optimization program which includes automated  exposure control, adjustment of the mA and/or kV according to patient size and/or use of iterative reconstruction technique. COMPARISON:  CT head 08/19/2022. FINDINGS: Motion limited. Brain: No evidence of acute infarction, hemorrhage, hydrocephalus, extra-axial collection or mass lesion/mass effect. Patchy white matter hypoattenuation, nonspecific but compatible with chronic microvascular disease. Similar cerebral atrophy. Vascular: Calcific intracranial atherosclerosis. Skull: No acute fracture. Sinuses/Orbits: Maxillary sinus retention cyst versus polyp with areas of mineralization. No acute orbital findings. Other: No mastoid effusions. IMPRESSION: Motion limited study without evidence of acute intracranial abnormality. Electronically Signed   By: Roslynn Amble  Ronnald Ramp M.D.   On: 08/21/2022 10:20   EEG adult  Result Date: 08/21/2022 Lora Havens, MD     08/21/2022 12:34 PM Patient Name: Katelynne Revak MRN: [id number] Epilepsy Attending: Lora Havens Referring Physician/Provider: Thurnell Lose, MD Date: 08/21/2022 Duration: 26.25 mins Patient history: 72 year old female with altered mental status.  EEG to evaluate for seizure. Level of alertness:  lethargic AEDs during EEG study: None Technical aspects: This EEG study was done with scalp electrodes positioned according to the 10-20 International system of electrode placement. Electrical activity was reviewed with band pass filter of 1-'70Hz'$ , sensitivity of 7 uV/mm, display speed of 23m/sec with a '60Hz'$  notched filter applied as appropriate. EEG data were recorded continuously and digitally stored.  Video monitoring was available and reviewed as appropriate. Description: No clear posterior dominant rhythm was seen.  EEG showed continuous generalized 2-3 hz delta slowing admixed with intermittent triphasic waves.  Hyperventilation and photic stimulation were not performed.   ABNORMALITY - Continuous slow, generalized -Triphasic waves, generalized IMPRESSION:  This study is suggestive of severe diffuse encephalopathy, nonspecific to etiology but most likely related to toxic-metabolic causes.  No seizures or definite epileptiform discharges were seen throughout the recording. Priyanka [name]

## 2022-08-24 NOTE — Progress Notes (Signed)
Daily Progress Note   Patient Name: Dorothy Cooper       Date: 08/24/2022 DOB: 10/18/1950  Age: 72 y.o. MRN#: [id number] Attending Physician: Dorothy Patricia, MD Primary Care Physician: Pcp, No Admit Date: 08/19/2022  Reason for Consultation/Follow-up: Establishing goals of care  Subjective: Chart review performed including progress notes.  Patient assessed at the bedside and is resting comfortably with no signs of pain or distress.  She has several family members visiting.  Received voicemail requesting follow up at the bedside. Patient's granddaughter Dorothy Cooper is on the phone and daughter Dorothy Cooper begins to share some of the concerns they have had since admission.  They have also discussed with Conservation officer, historic buildings and primary attending prior to my arrival.  Emotional support and therapeutic listening was provided.  Offered to assist with coordination of patient experience involvement for advocacy and family agrees.  Followed up on family wishes for residential hospice placement if patient remains stable when a bed becomes available. I expressed my concern about the timing of discharge with ongoing hypotension and they feel "that's been her blood pressure the whole time." They have found a way to achieve transportation for $500 and Dorothy Cooper is actively on the phone with [facility], tells me they are first on the list but the facility is still full.   All questions and concerns addressed. Encouraged to call with questions and/or concerns. PMT card provided.  Length of Stay: 4  Physical Exam Vitals and nursing note reviewed.  Constitutional:      General: She is not in acute distress.    Appearance: She is cachectic. She is ill-appearing.  Cardiovascular:     Rate and Rhythm: Bradycardia  present.  Pulmonary:     Effort: No respiratory distress.  Skin:    General: Skin is warm and dry.  Neurological:     Mental Status: She is unresponsive.     Motor: Weakness present.             Vital Signs: BP (!) 76/44 (BP Location: Right Arm)   Pulse (!) 59   Temp (!) 92.6 F (33.7 C) (Axillary)   Resp 12   Ht '5\' 2"'$  (1.575 m)   Wt 58.2 kg   LMP  (LMP Unknown)   SpO2 100%   BMI 23.47 kg/m  SpO2: SpO2: 100 %  O2 Device: O2 Device: Nasal Cannula O2 Flow Rate: O2 Flow Rate (L/min): 2 L/min       Palliative Assessment/Data: PPS 10%    Palliative Care Assessment & Plan   Patient Profile: 72 y.o. female  with past medical history of end-stage renal disease on hemodialysis Tuesday/Thursday/ Saturday, thrombocytopenia, cirrhosis  unclear reason, diastolic CHF, chronic anemia, asthma, COPD, DM2, glaucoma, HLD, HTN pancytopenia, on eliquis for A.fib, migraine headaches presented to ED on 08/19/22 from [facility] with AMS. Patient was admitted on 08/19/2022 with acute metabolic encephalopathy, UTI, cirrhosis, hypotension, thrombocytopenia, cirrhosis, L and T-spine compression fracture. Neurosurgery states patient is not a surgical candidate; only option is TLSO brace.   Assessment: Principal Problem:   Acute metabolic encephalopathy Active Problems:   UTI (urinary tract infection)   Cirrhosis (HCC)   DM2 (diabetes mellitus, type 2) (HCC)   Paroxysmal atrial fibrillation (HCC)   Elevated INR   Chronic diastolic CHF (congestive heart failure) (HCC)   Hypotension   ESRD (end stage renal disease) (HCC)   Thrombocytopenia (HCC)   Compression fracture of lumbar vertebra (HCC)   Pressure injury of skin   Other pancytopenia ([place])   Terminal care  Recommendations/Plan: Continue DNR Continue comfort care measures Patient family now wants to transfer to [facility] again and have arranged for affordable transport, TOC aware and assisting  Assisted family with  contacting Patient Experience regarding several concerns over the past few days Therapeutic listening and emotional support provided PMT will continue to follow and support holistically   Prognosis:  < 2 weeks  Discharge Planning: Hospice facility vs hospital death  Care plan was discussed with 5W Charge RN, patient's family, Patient Experience Dept, TOC, Dr. [facility]ron [facility]   Total time: I spent 50 minutes in the care of the patient today in the above activities and documenting the encounter.   Dorothy Cooler, PA-C Palliative Medicine Team Team phone # [phone]  Thank you for allowing the Palliative Medicine Team to assist in the care of this patient. Please utilize secure chat with additional questions, if there is no response within 30 minutes please call the above phone number.  Palliative Medicine Team providers are available by phone from 7am to 7pm daily and can be reached through the team cell phone.  Should this patient require assistance outside of these hours, please call the patient's attending physician.

## 2022-08-24 NOTE — TOC Progression Note (Addendum)
Transition of Care [facility]) - Progression Note    Patient Details  Name: Cristina Ceniceros MRN: [id number] Date of Birth: 1950-04-19  Transition of Care [facility]) CM/SW [place], [place] Phone Number: 08/24/2022, 9:26 AM  Clinical Narrative:    CSW was notified by [facility] Liaison that pt's family was not interested in [facility]. CSW received call from [name] who noted they were wanting to proceed with [facility] and family had found transport that was cheaper. CSW confirmed it was stretcher transport. CSW spoke with hospice facility that stated pt had been approved, and they would let CSW know if a bed opened up. Information for weekend CSW given, in case a bed is available tomorrow. Hospice requests on transport that IV access be left in and that pt receive pain medications prior to transport. TOC will continue to follow for DC needs.  3:40 CSW followed up with Colletta Maryland at [facility] to see if there was an update. There is no bed available at this time. CSW will continue to follow for DC needs.  Expected Discharge Plan: [place] Barriers to Discharge: Continued Medical Work up  Expected Discharge Plan and Services Expected Discharge Plan: [place] Choice: [place] arrangements for the past 2 months: [place]                                       Social Determinants of Health (SDOH) Interventions    Readmission Risk Interventions     No data to display

## 2022-08-25 DIAGNOSIS — K7460 Unspecified cirrhosis of liver: Secondary | ICD-10-CM | POA: Diagnosis not present

## 2022-08-25 DIAGNOSIS — Z515 Encounter for palliative care: Secondary | ICD-10-CM

## 2022-08-25 DIAGNOSIS — G9341 Metabolic encephalopathy: Secondary | ICD-10-CM | POA: Diagnosis not present

## 2022-08-25 DIAGNOSIS — R4182 Altered mental status, unspecified: Secondary | ICD-10-CM | POA: Diagnosis not present

## 2022-08-25 DIAGNOSIS — S32000A Wedge compression fracture of unspecified lumbar vertebra, initial encounter for closed fracture: Secondary | ICD-10-CM | POA: Diagnosis not present

## 2022-08-25 LAB — COPPER, SERUM: Copper: 58 ug/dL — ABNORMAL LOW (ref 80–158)

## 2022-08-25 IMAGING — DX DG CHEST 2V
2 series · 2 of 2 positions shown · non-contrast
Comparison: None Available.

CLINICAL DATA: Shortness of breath.

EXAM:
CHEST - 2 VIEW

[chest lat]
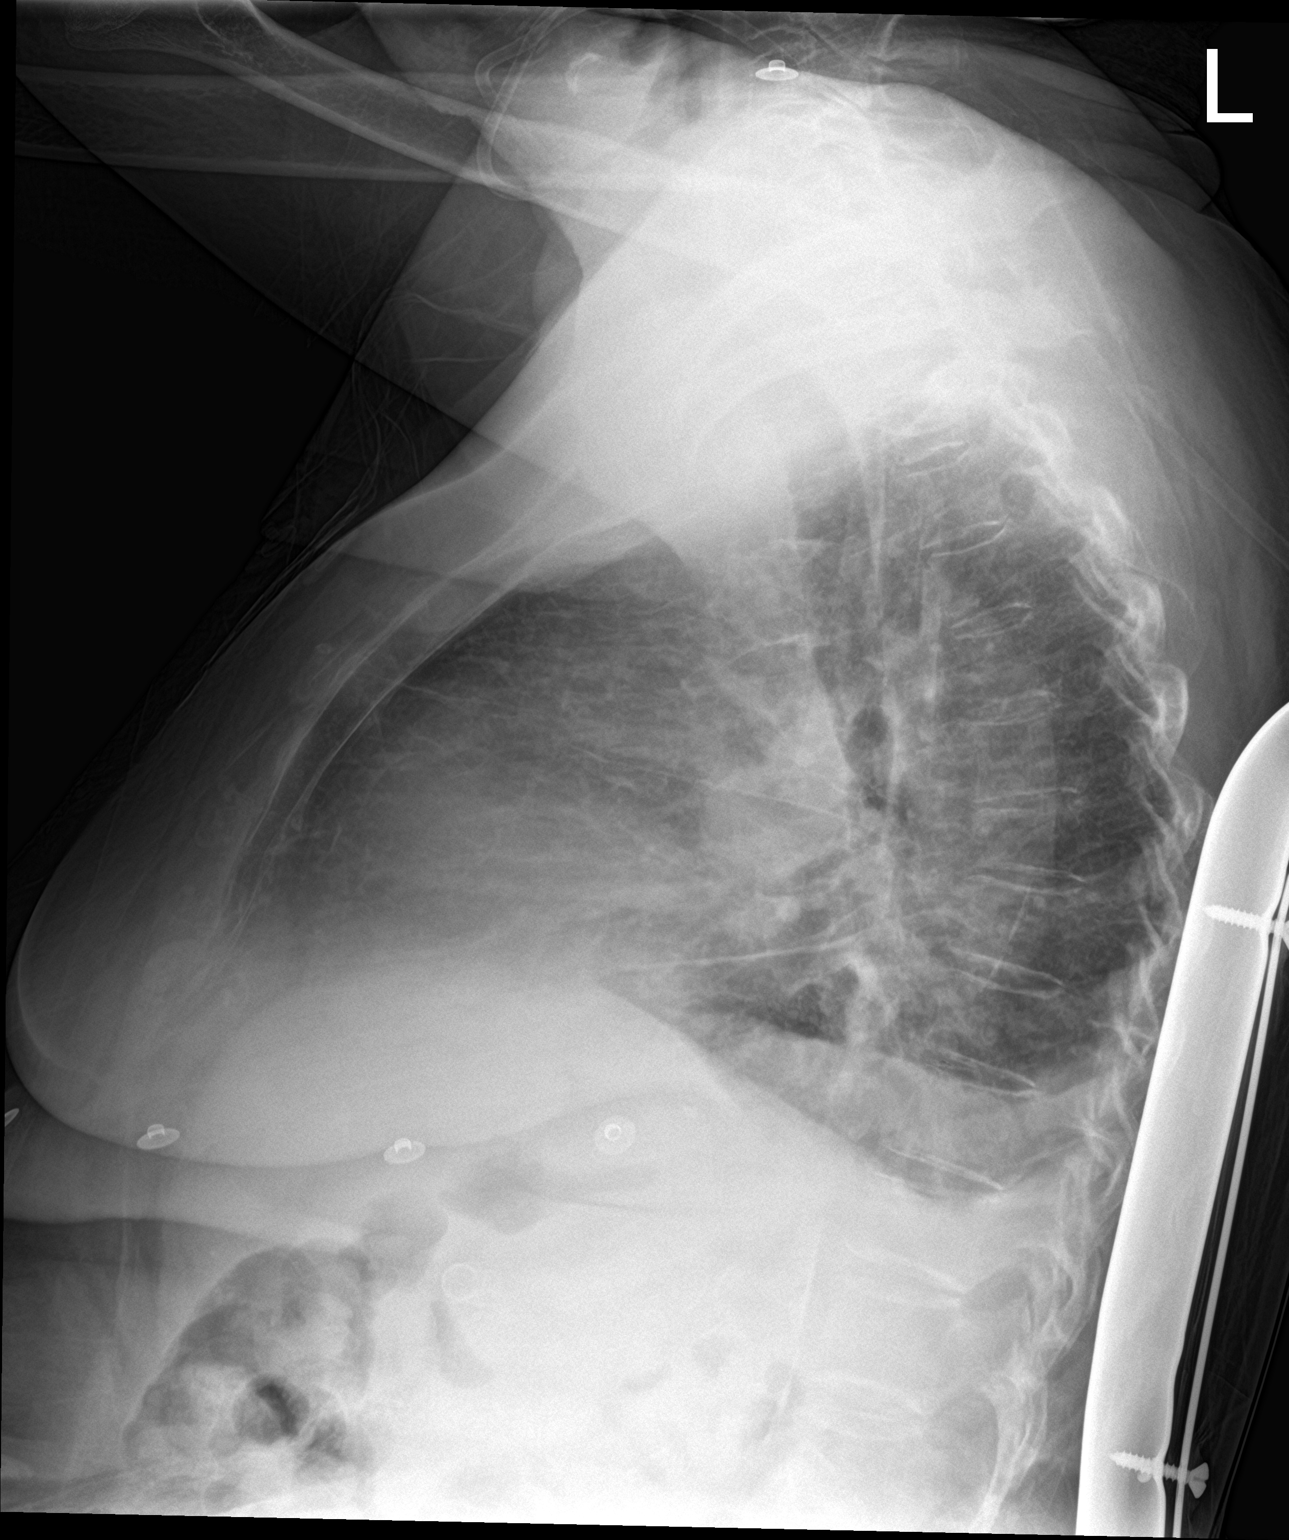

[chest ap]
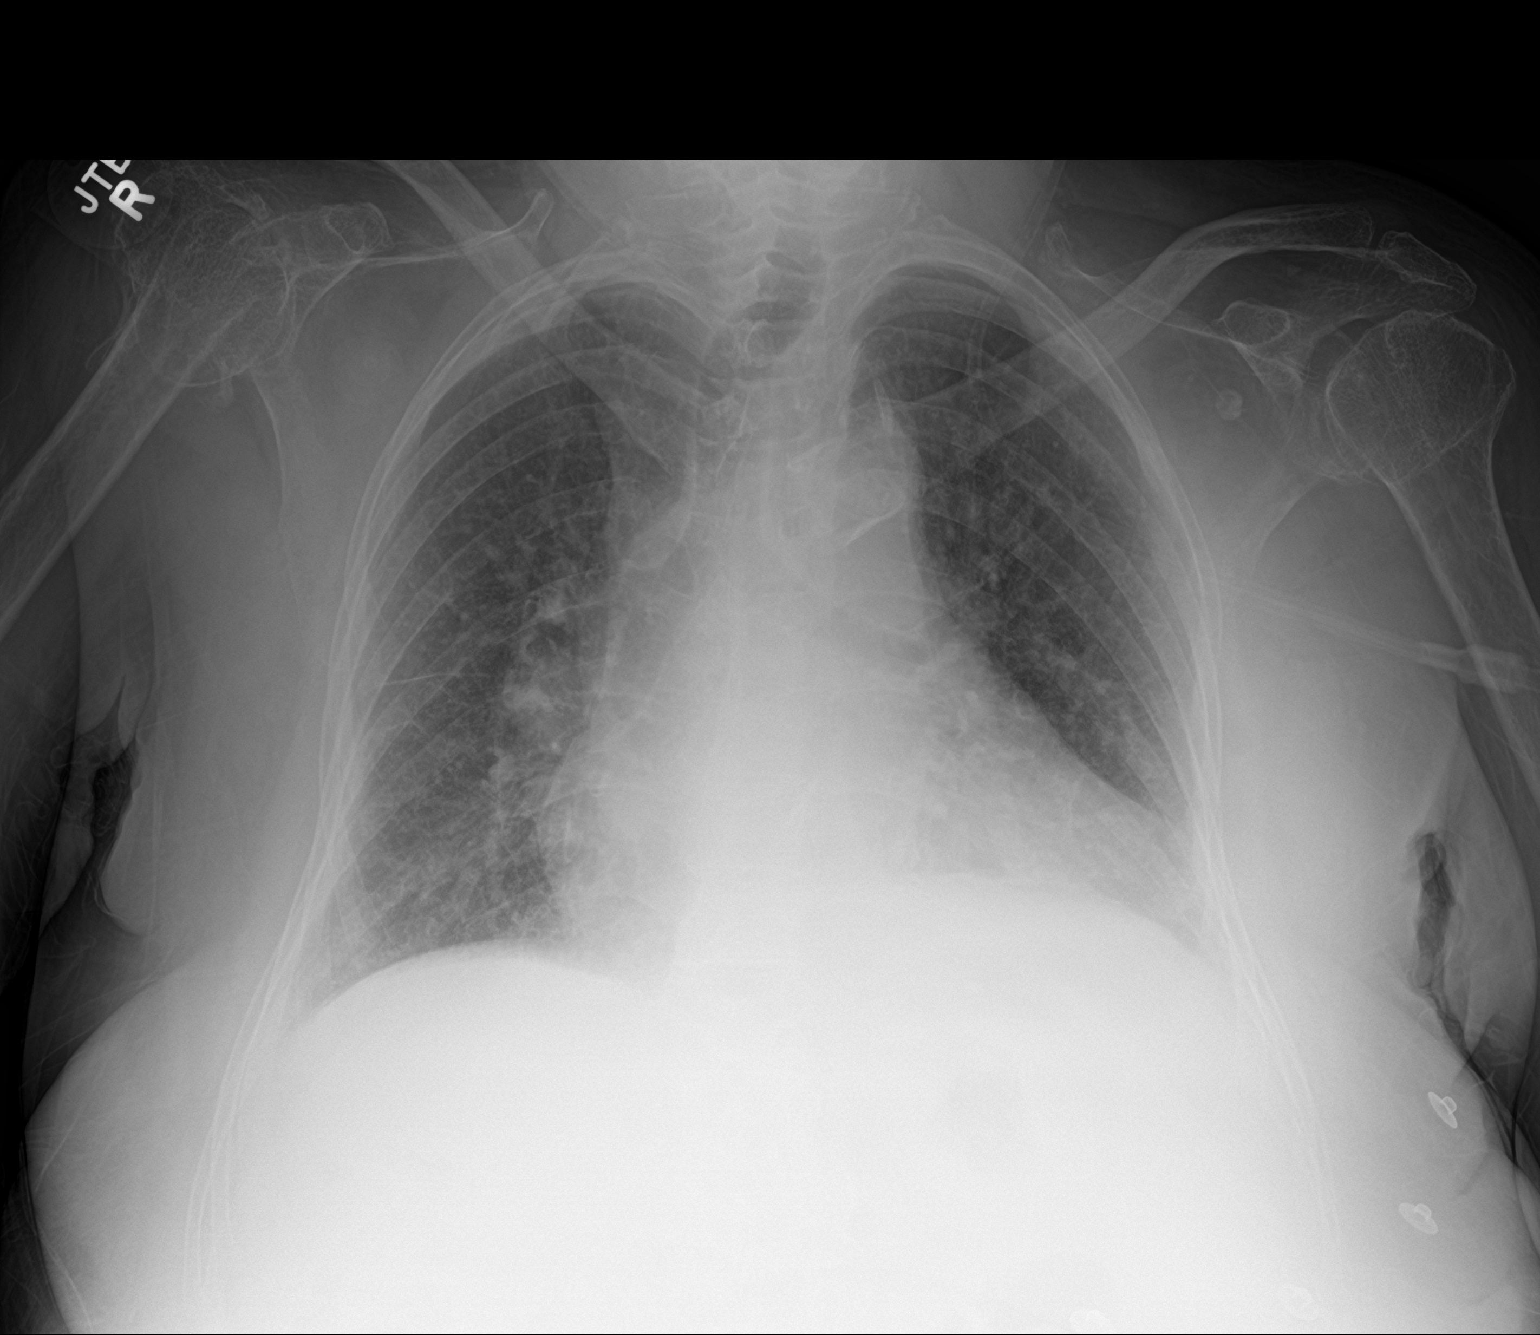

[2 of 2 positions shown; findings below may reference images not displayed]

FINDINGS: Mild cardiomegaly is noted. Mild bibasilar subsegmental atelectasis
or possibly edema is noted with small bilateral pleural effusions.
Bony thorax is unremarkable.
IMPRESSION: Mild bibasilar subsegmental atelectasis or possibly edema is noted
with small bilateral pleural effusions.

## 2022-08-25 MED ORDER — POLYVINYL ALCOHOL 1.4 % OP SOLN: 1.0000 [drp] | Freq: Four times a day (QID) | OPHTHALMIC | 0 refills | Status: AC | PRN

## 2022-08-25 MED ORDER — ONDANSETRON 4 MG PO TBDP: 4.0000 mg | ORAL_TABLET | Freq: Four times a day (QID) | ORAL | 0 refills | Status: AC | PRN

## 2022-08-25 MED ORDER — SALINE SPRAY 0.65 % NA SOLN: 1.0000 | NASAL | 0 refills | Status: AC | PRN

## 2022-08-25 MED ORDER — ACETAMINOPHEN 650 MG RE SUPP: 650.0000 mg | Freq: Four times a day (QID) | RECTAL | 0 refills | Status: AC | PRN

## 2022-08-25 MED ORDER — GLYCOPYRROLATE 1 MG PO TABS: 1.0000 mg | ORAL_TABLET | ORAL | Status: AC | PRN

## 2022-08-25 MED ORDER — LORAZEPAM 2 MG/ML PO CONC: 1.0000 mg | ORAL | 0 refills | Status: AC | PRN

## 2022-08-25 MED ORDER — HYDROMORPHONE HCL 2 MG PO TABS: 2.0000 mg | ORAL_TABLET | Freq: Two times a day (BID) | ORAL | 0 refills | Status: AC | PRN

## 2022-08-25 NOTE — Discharge Summary (Signed)
Physician Discharge Summary  Dorothy Cooper XBM:[id number] DOB: 02-21-1950 DOA: 08/19/2022  PCP: Pcp, No  Admit date: 08/19/2022 Discharge date: 08/25/2022  Admitted From:  SNF Disposition:  Residential Hospice  Recommendations for Outpatient Follow-up:  Management per hospice    Discharge Condition: hospice CODE STATUS: (DNR/ Comfort Care)   Brief/Interim Summary:  72 y.o. 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 In the ER she was found to have toxic and metabolic encephalopathy, acute on chronic back and abdominal pain, ESBL UTI, acute on chronic thrombocytopenia with coagulopathy. significant liver cirrhosis/end-stage liver disease, patient has been transitioned to comfort care given poor prognosis.    Acute metabolic and toxic encephalopathy.   Multifactorial due to combination of UTI, acute on chronic pain and being on narcotics, to a much lesser degree hyperammonemia from hepatic encephalopathy (ammonia level was at 50 ).   ESBL UTI -Treated with meropenem   Chronic pain syndrome with multiple T and L-spine compression fractures.  Age-indeterminate.   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    Paroxysmal atrial fibrillation.  Mali vas 2 score > 3.     Hypotension.    Chronic  pain    COPD/asthma.    Dyslipidemia.     Chronic intermittent Vaginal bleed -This is in the setting of her coagulopathy and thrombocytopenia, patient is currently comfort measures.   DM type II.       Goals of care discussion -Palliative  input greatly appreciated, patient with multiple comorbidities, and significant deterioration recently, had significant multiorgan failures including liver cirrhosis, ESRD, pancytopenia and progressive encephalopathy,  as well she is with significant chronic pain syndrome, requiring significant dose of narcotics leading to significant  encephalopathy, as well she does appear uncomfortable  due to pain, after discussion with palliative medicine decision has been made to proceed with comfort measures given the nature of her irreversible organ dysfunctions.  Patient is currently on full comfort measures, she appears to be more comfortable on current regimen , we wish for the patient to be discharged to a hospice facility at [facility] where she  is close to family.    Discharge Diagnoses:  Principal Problem:   Acute metabolic encephalopathy Active Problems:   UTI (urinary tract infection)   Cirrhosis (HCC)   DM2 (diabetes mellitus, type 2) (HCC)   Paroxysmal atrial fibrillation (HCC)   Elevated INR   Chronic diastolic CHF (congestive heart failure) (  [place])   Hypotension   ESRD (end stage renal disease) (HCC)    Thrombocytopenia (HCC)   Compression fracture of lumbar vertebra ([place])   Pressure injury of skin   Other pancytopenia [facility])   Hospice care patient   Palliative care patient    Discharge Instructions  Discharge Instructions     Diet - low sodium heart healthy   Complete by: As directed    Increase activity slowly   Complete by: As directed    No wound care   Complete by: As directed       Allergies as of 08/25/2022       Reactions   Penicillins Anaphylaxis   Amlodipine    Aspirin    Throat swells   Gabapentin         Medication List     STOP taking these medications    albuterol 108 (90 Base) MCG/ACT inhaler Commonly known as: VENTOLIN HFA   calcium acetate 667 MG capsule Commonly known as: PHOSLO   Camphor-Menthol-Methyl Sal 04-09-29 % Gel   carvedilol 3.125 MG tablet Commonly known as: COREG   cholecalciferol 25 MCG (1000 UNIT) tablet Commonly known as: VITAMIN D3   Decubi-Vite Caps   feeding supplement (PRO-STAT SUGAR FREE 64) Liqd   latanoprost 0.005 % ophthalmic solution Commonly known as: XALATAN   lovastatin 10 MG tablet Commonly known as: MEVACOR   midodrine 5 MG tablet Commonly known as: PROAMATINE   Refresh Contacts Drops Soln   sertraline 50 MG tablet Commonly known as: ZOLOFT   Simbrinza 1-0.2 % Susp Generic drug: Brinzolamide-Brimonidine   topiramate 25 MG tablet Commonly known as: TOPAMAX       TAKE these medications    acetaminophen 650 MG suppository Commonly known as: TYLENOL Place 1 suppository (650 mg total) rectally every 6 (six) hours as needed for mild pain (or Fever >/= 101).   glycopyrrolate 1 MG tablet Commonly known as: ROBINUL Take 1 tablet (1 mg total) by mouth every 4 (four) hours as needed (excessive secretions).   HYDROmorphone 2 MG tablet Commonly known as: Dilaudid Take 1 tablet (2 mg total) by mouth every 12 (twelve) hours as needed for severe pain (pain or dyspnea for comfort care). What  changed:  when to take this reasons to take this Another medication with the same name was removed. Continue taking this medication, and follow the directions you see here.   LORazepam 2 MG/ML concentrated solution Commonly known as: ATIVAN Take 0.5 mLs (1 mg total) by mouth every 4 (four) hours as needed for anxiety (dyspnea for comfort care).   ondansetron 4 MG disintegrating tablet Commonly known as: ZOFRAN-ODT Take 1 tablet (4 mg total) by mouth every 6 (six) hours as needed for nausea.   polyvinyl alcohol 1.4 % ophthalmic solution Commonly known as: LIQUIFILM TEARS Place 1 drop into both eyes 4 (four) times daily as needed for dry eyes.   sodium chloride 0.65 % Soln nasal spray Commonly known as: OCEAN Place 1 spray into both nostrils as needed for congestion.        Allergies  Allergen Reactions   Penicillins Anaphylaxis   Amlodipine    Aspirin     Throat swells   Gabapentin     Consultations:    Procedures/Studies: CT HEAD WO CONTRAST (5MM)  Result Date: 08/21/2022 CLINICAL DATA:  Delirium EXAM: CT HEAD WITHOUT CONTRAST TECHNIQUE: Contiguous axial images were obtained from the base of the skull through the vertex without intravenous contrast. RADIATION DOSE REDUCTION:  This exam was performed according to the departmental dose-optimization program which includes automated exposure control, adjustment of the mA and/or kV according to patient size and/or use of iterative reconstruction technique. COMPARISON:  CT head 08/19/2022. FINDINGS: Motion limited. Brain: No evidence of acute infarction, hemorrhage, hydrocephalus, extra-axial collection or mass lesion/mass effect. Patchy white matter hypoattenuation, nonspecific but compatible with chronic microvascular disease. Similar cerebral atrophy. Vascular: Calcific intracranial atherosclerosis. Skull: No acute fracture. Sinuses/Orbits: Maxillary sinus retention cyst versus polyp with areas of mineralization. No acute orbital  findings. Other: No mastoid effusions. IMPRESSION: Motion limited study without evidence of acute intracranial abnormality. Electronically Signed   By: Margaretha Sheffield M.D.   On: 08/21/2022 10:20   EEG adult  Result Date: 08/21/2022 Lora Havens, MD     08/21/2022 12:34 PM Patient Name: Dorothy Cooper MRN: [id number] Epilepsy Attending: Lora Havens Referring Physician/Provider: Thurnell Lose, MD Date: 08/21/2022 Duration: 26.25 mins Patient history: 72 year old female with altered mental status.  EEG to evaluate for seizure. Level of alertness:  lethargic AEDs during EEG study: None Technical aspects: This EEG study was done with scalp electrodes positioned according to the 10-20 International system of electrode placement. Electrical activity was reviewed with band pass filter of 1-'70Hz'$ , sensitivity of 7 uV/mm, display speed of 39m/sec with a '60Hz'$  notched filter applied as appropriate. EEG data were recorded continuously and digitally stored.  Video monitoring was available and reviewed as appropriate. Description: No clear posterior dominant rhythm was seen.  EEG showed continuous generalized 2-3 hz delta slowing admixed with intermittent triphasic waves.  Hyperventilation and photic stimulation were not performed.   ABNORMALITY - Continuous slow, generalized -Triphasic waves, generalized IMPRESSION: This study is suggestive of severe diffuse encephalopathy, nonspecific to etiology but most likely related to toxic-metabolic causes.  No seizures or definite epileptiform discharges were seen throughout the recording. PLora Havens  DG Chest Port 1 View  Result Date: 08/20/2022 CLINICAL DATA:  72year old female with history of altered mental status. EXAM: PORTABLE CHEST 1 VIEW COMPARISON:  Chest x-ray 08/19/2022. FINDINGS: Lung volumes are low. No consolidative airspace disease. There is cephalization of the pulmonary vasculature and slight indistinctness of the interstitial markings  suggestive of mild pulmonary edema. Trace right pleural effusion. No definite left pleural effusion. Mild cardiomegaly. The patient is rotated to the right on today's exam, resulting in distortion of the mediastinal contours and reduced diagnostic sensitivity and specificity for mediastinal pathology. Atherosclerotic calcifications in the thoracic aorta. IMPRESSION: 1. The appearance the chest suggests mild congestive heart failure, as above. 2. Aortic atherosclerosis. Electronically Signed   By: DVinnie LangtonM.D.   On: 08/20/2022 06:54   CT CHEST ABDOMEN PELVIS W CONTRAST  Result Date: 08/19/2022 CLINICAL DATA:  Sepsis. EXAM: CT CHEST, ABDOMEN, AND PELVIS WITH CONTRAST TECHNIQUE: Multidetector CT imaging of the chest, abdomen and pelvis was performed following the standard protocol during bolus administration of intravenous contrast. RADIATION DOSE REDUCTION: This exam was performed according to the departmental dose-optimization program which includes automated exposure control, adjustment of the mA and/or kV according to patient size and/or use of iterative reconstruction technique. CONTRAST:  830mOMNIPAQUE IOHEXOL 300 MG/ML  SOLN COMPARISON:  Chest radiograph earlier today. FINDINGS: CT CHEST FINDINGS Cardiovascular: Aortic atherosclerosis and tortuosity without aneurysm or acute aortic findings. No obvious central pulmonary embolus on this exam not tailored for pulmonary arteries S mint. Borderline cardiomegaly. There are coronary artery calcifications. No pericardial effusion. Mediastinum/Nodes: No mediastinal adenopathy or mass. No hilar adenopathy. Decompressed  esophagus. No visible thyroid nodule. Lungs/Pleura: Motion artifact through the bases. Dependent atelectasis in the right greater than left lower lobe. No confluent consolidation. Mild central bronchial thickening without endobronchial lesion. No significant pleural effusion. Musculoskeletal: Remote right proximal humerus fracture with  posttraumatic deformity. Exaggerated thoracic kyphosis. The bones are diffusely under mineralized. Mild T6 compression deformity. No posterior cortex involvement. CT ABDOMEN PELVIS FINDINGS Hepatobiliary: Nodular hepatic contours consistent with cirrhosis. There is no discrete focal hepatic lesion. Motion artifact through the liver limits assessment. Innumerable calcified gallstones. No definite pericholecystic inflammation. No biliary dilatation, although the common bile duct is poorly defined. Pancreas: No ductal dilatation or inflammation. Spleen: Splenomegaly with spleen measuring 15.1 x 6 x 13.4 cm (volume = 600 cm^3). Small subcapsular low-density posteriorly series 3, image 49, nonspecific. Adrenals/Urinary Tract: No adrenal nodule. Bilateral renal parenchymal atrophy. No hydronephrosis or focal renal lesion. Urinary bladder is obscured by streak artifact from left hip arthroplasty. 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 The stomach is decompressed. There is no small bowel obstruction or inflammation. Small to moderate volume of colonic stool without colonic inflammation. The appendix is not visualized. Vascular/Lymphatic: Moderate to advanced aortic and branch atherosclerosis. No aortic aneurysm. There is no evidence of portal vein thrombosis, although phase of contrast limits portal vein assessment. Left upper quadrant collaterals with splenorenal shunting. Reproductive: Uterus primarily obscured by streak artifact from left hip arthroplasty. There is no obvious adnexal mass. Other: Minimal perihepatic ascites. Mild generalized edema of the subcutaneous and intra-abdominal fat. No free air. Surgical staples in the anterior abdominal wall. Musculoskeletal: Bones diffusely under mineralized. Marked compression deformity of L2 and L3 with buckling of the posterior cortex. Moderate L5 compression fracture. There is a fracture through the superior aspect of L1 vertebral body that is age indeterminate.  Left hip arthroplasty. IMPRESSION: 1. Hepatic cirrhosis. Splenomegaly and left upper quadrant collaterals and splenorenal shunting. Minimal perihepatic ascites. 2. Cholelithiasis without definite pericholecystic inflammation. 3. Dependent atelectasis in both lungs.  No pneumonia. 4. Cardiomegaly.  Coronary artery calcifications. 5. Multiple thoracic and lumbar compression deformities. Marked compression deformity of L2 and L3 with buckling of the posterior cortex. Moderate L5 compression fracture. There is a fracture through the superior aspect of L1 vertebral body that is age indeterminate. Mild T6 compression fracture. Recommend correlation with focal tenderness. Aortic Atherosclerosis (ICD10-I70.0). Electronically Signed   By: Keith Rake M.D.   On: 08/19/2022 18:56   CT Head Wo Contrast  Result Date: 08/19/2022 CLINICAL DATA:  Mental status change, unknown cause. EXAM: CT HEAD WITHOUT CONTRAST TECHNIQUE: Contiguous axial images were obtained from the base of the skull through the vertex without intravenous contrast. RADIATION DOSE REDUCTION: This exam was performed according to the departmental dose-optimization program which includes automated exposure control, adjustment of the mA and/or kV according to patient size and/or use of iterative reconstruction technique. COMPARISON:  None Available. FINDINGS: Brain: No evidence of acute infarction, hemorrhage, hydrocephalus, extra-axial collection or mass lesion/mass effect. Prominence of the ventricles and sulci secondary to mild cerebral volume loss. Patchy areas of low-attenuation presumed chronic microvascular ischemic changes. Vascular: No hyperdense vessel or unexpected calcification. Skull: Normal. Negative for fracture or focal lesion. Sinuses/Orbits: No acute finding. Other: None. IMPRESSION: 1.  No acute intracranial abnormality. 2. Mild cerebral volume loss and chronic microvascular ischemic changes of the white matter. Electronically Signed    By: Keane Police D.O.   On: 08/19/2022 16:48   DG Chest Portable 1 View  Result Date: 08/19/2022 CLINICAL DATA:  Altered mental status  EXAM: PORTABLE CHEST 1 VIEW COMPARISON:  Chest radiograph 05/01/2022 FINDINGS: The heart is enlarged, unchanged. The upper mediastinal contours are stable. There is no focal consolidation or pulmonary edema. There is no pleural effusion or pneumothorax There is no acute osseous abnormality. IMPRESSION: Unchanged cardiomegaly.  No focal consolidation or pleural effusion. Electronically Signed   By: Valetta Mole M.D.   On: 08/19/2022 16:25      Subjective:  Discussed with granddaughter at bedside, patient appears comfortable, patient is still having vaginal bleeding.   Discharge Exam: Vitals:   08/25/22 1000 08/25/22 1100  BP:    Pulse:    Resp: 13 14  Temp:    SpO2:     Vitals:   08/25/22 0838 08/25/22 0900 08/25/22 1000 08/25/22 1100  BP: (!) 94/46     Pulse: 72     Resp: '15 13 13 14  '$ Temp:      TempSrc:      SpO2: 98%     Weight:      Height:         She  is nonresponsive, appears comfortable  Fair air entry bilaterally  Regular rate and rhythm She has upper extremities edema, pale, no cyanosis     The results of significant diagnostics from this hospitalization (including imaging, microbiology, ancillary and laboratory) are listed below for reference.     Microbiology: Recent Results (from the past 240 hour(s))  Urine Culture     Status: Abnormal   Collection Time: 08/19/22  9:42 PM   Specimen: In/Out Cath Urine  Result Value Ref Range Status   Specimen Description IN/OUT CATH URINE  Final   Special Requests   Final    NONE Performed at [facility], [address] [place], [place] [address]    Culture (A)  Final    >=100,000 COLONIES/mL KLEBSIELLA PNEUMONIAE Confirmed Extended Spectrum Beta-Lactamase Producer (ESBL).  In bloodstream infections from ESBL organisms, carbapenems are preferred over  piperacillin/tazobactam. They are shown to have a lower risk of mortality.    Report Status 08/22/2022 FINAL  Final   Organism ID, Bacteria KLEBSIELLA PNEUMONIAE (A)  Final      Susceptibility   Klebsiella pneumoniae - MIC*    AMPICILLIN >=32 RESISTANT Resistant     CEFAZOLIN >=64 RESISTANT Resistant     CEFEPIME >=32 RESISTANT Resistant     CEFTRIAXONE >=64 RESISTANT Resistant     CIPROFLOXACIN <=0.25 SENSITIVE Sensitive     GENTAMICIN <=1 SENSITIVE Sensitive     IMIPENEM <=0.25 SENSITIVE Sensitive     NITROFURANTOIN 128 RESISTANT Resistant     TRIMETH/SULFA >=320 RESISTANT Resistant     AMPICILLIN/SULBACTAM 16 INTERMEDIATE Intermediate     PIP/TAZO <=4 SENSITIVE Sensitive     * >=100,000 COLONIES/mL KLEBSIELLA PNEUMONIAE     Labs: BNP (last 3 results) Recent Labs    08/20/22 0500 08/21/22 0313 08/22/22 0742  BNP 705.0* 858.9* 154.0*   Basic Metabolic Panel: Recent Labs  Lab 08/19/22 1515 08/19/22 2237 08/20/22 0500 08/21/22 0313 08/22/22 0742  NA 132*  --  131* 130* 132*  K 4.1  --  3.9 3.4* 2.9*  CL 92*  --  93* 92* 95*  CO2 28  --  '28 25 29  '$ GLUCOSE 179*  --  125* 160* 107*  BUN 52*  --  60* 72* 31*  CREATININE 5.35*  --  5.56* 6.18* 3.59*  CALCIUM 10.1  --  9.7 9.9 8.7*  MG 2.9*  --  2.9* 2.9*  2.1  PHOS  --  4.0  --   --   --    Liver Function Tests: Recent Labs  Lab 08/19/22 1515 08/20/22 0500 08/21/22 0313 08/22/22 0742  AST 60* 65* 33 47*  ALT 33 '28 25 28  '$ ALKPHOS 75 62 70 71  BILITOT 2.2* 1.9* 2.1* 2.7*  PROT 5.4* 5.2* 4.7* 4.7*  ALBUMIN 2.1* 2.2* 2.0* 2.0*   No results for input(s): "LIPASE", "AMYLASE" in the last 168 hours. Recent Labs  Lab 08/19/22 1812 08/21/22 0809 08/22/22 0742  AMMONIA 27 50* 31   CBC: Recent Labs  Lab 08/19/22 1515 08/19/22 1855 08/20/22 0500 08/21/22 0313 08/22/22 0742  WBC 4.7  --  4.2  4.2 5.2 5.1  NEUTROABS 4.1  --  3.5 4.1 4.7  HGB 9.1*  --  7.9*  7.7* 8.2* 7.7*  HCT 27.4*  --  24.0*  23.9*  24.1* 23.1*  MCV 106.6*  --  107.1*  105.8* 101.7* 101.8*  PLT 26* 21* 22*  22* 17* 17*   Cardiac Enzymes: Recent Labs  Lab 08/19/22 2237  CKTOTAL 48   BNP: Invalid input(s): "POCBNP" CBG: Recent Labs  Lab 08/21/22 2022 08/21/22 2038 08/21/22 2105 08/22/22 0306 08/22/22 0811  GLUCAP 66* 62* 181* 84 108*   D-Dimer No results for input(s): "DDIMER" in the last 72 hours. Hgb A1c No results for input(s): "HGBA1C" in the last 72 hours. Lipid Profile No results for input(s): "CHOL", "HDL", "LDLCALC", "TRIG", "CHOLHDL", "LDLDIRECT" in the last 72 hours. Thyroid function studies No results for input(s): "TSH", "T4TOTAL", "T3FREE", "THYROIDAB" in the last 72 hours.  Invalid input(s): "FREET3" Anemia work up No results for input(s): "VITAMINB12", "FOLATE", "FERRITIN", "TIBC", "IRON", "RETICCTPCT" in the last 72 hours. Urinalysis    Component Value Date/Time   COLORURINE YELLOW 08/19/2022 1505   APPEARANCEUR CLEAR 08/19/2022 1505   LABSPEC 1.020 08/19/2022 1505   PHURINE 6.5 08/19/2022 1505   GLUCOSEU NEGATIVE 08/19/2022 1505   HGBUR LARGE (A) 08/19/2022 1505   BILIRUBINUR MODERATE (A) 08/19/2022 1505   KETONESUR 15 (A) 08/19/2022 1505   PROTEINUR 100 (A) 08/19/2022 1505   NITRITE POSITIVE (A) 08/19/2022 1505   LEUKOCYTESUR LARGE (A) 08/19/2022 1505   Sepsis Labs Recent Labs  Lab 08/19/22 1515 08/20/22 0500 08/21/22 0313 08/22/22 0742  WBC 4.7 4.2  4.2 5.2 5.1   Microbiology Recent Results (from the past 240 hour(s))  Urine Culture     Status: Abnormal   Collection Time: 08/19/22  9:42 PM   Specimen: In/Out Cath Urine  Result Value Ref Range Status   Specimen Description IN/OUT CATH URINE  Final   Special Requests   Final    NONE Performed at [facility], [address], [place], [place] [address]    Culture (A)  Final    >=100,000 COLONIES/mL KLEBSIELLA PNEUMONIAE Confirmed Extended Spectrum Beta-Lactamase Producer (ESBL).  In bloodstream  infections from ESBL organisms, carbapenems are preferred over piperacillin/tazobactam. They are shown to have a lower risk of mortality.    Report Status 08/22/2022 FINAL  Final   Organism ID, Bacteria KLEBSIELLA PNEUMONIAE (A)  Final      Susceptibility   Klebsiella pneumoniae - MIC*    AMPICILLIN >=32 RESISTANT Resistant     CEFAZOLIN >=64 RESISTANT Resistant     CEFEPIME >=32 RESISTANT Resistant     CEFTRIAXONE >=64 RESISTANT Resistant     CIPROFLOXACIN <=0.25 SENSITIVE Sensitive     GENTAMICIN <=1 SENSITIVE Sensitive     IMIPENEM <=0.25  SENSITIVE Sensitive     NITROFURANTOIN 128 RESISTANT Resistant     TRIMETH/SULFA >=320 RESISTANT Resistant     AMPICILLIN/SULBACTAM 16 INTERMEDIATE Intermediate     PIP/TAZO <=4 SENSITIVE Sensitive     * >=100,000 COLONIES/mL KLEBSIELLA PNEUMONIAE     Time coordinating discharge: Over 30 minutes  SIGNED:   Phillips Climes, MD  [facility] 08/25/2022, 12:24 PM Pager   If 7PM-7AM, please contact night-coverage [email or web address]

## 2022-08-25 NOTE — Progress Notes (Signed)
Randal Buba, RN of [facility] given report, all questions answered.  Will leave IV in place for transfer to Hospice and give pain medication before she leaves for comfort during long drive

## 2022-08-25 NOTE — Discharge Instructions (Signed)
Management per hospice 

## 2022-08-25 NOTE — Progress Notes (Signed)
PROGRESS NOTE                                                                                                                                                                                                             Patient Demographics:    Dorothy Cooper, is a 72 y.o. female, DOB - [birth year], JOA:[id number]  Outpatient Primary MD for the patient is Pcp, No    LOS - 5  Admit date - 08/19/2022    Chief Complaint  Patient presents with   Altered Mental Status       Brief Narrative (HPI from H&P)    72 y.o. 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 In the ER she was found to have toxic and metabolic encephalopathy, acute on chronic back and abdominal pain, UTI, acute on chronic thrombocytopenia with elevated INR.  Her work-up was significant for he is be L UTI, and significant liver cirrhosis/end-stage liver disease, patient has been transitioned to comfort care given poor prognosis   Subjective:   Discussed with granddaughter at bedside, patient appears comfortable, patient is still having vaginal bleeding.   Assessment  & Plan :   Acute metabolic and toxic encephalopathy.   Multifactorial due to combination of UTI, acute on chronic pain and being on narcotics, to a much lesser degree hyperammonemia from hepatic encephalopathy (ammonia level was at 50 ).  ESBL UTI -Treated with meropenem  T and L-spine compression fractures.  Age-indeterminate.    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  On TTS schedule.  Nephrology has been called.  Paroxysmal atrial fibrillation.  Mali vas 2 score > 3.    Hypotension.   Chr pain   COPD/asthma.   Dyslipidemia.    Chronic intermittent Vaginal bleed -This is in the setting of her coagulopathy and thrombocytopenia, patient is currently comfort measures.  DM type II.     Goals of care discussion -Rate of input greatly appreciated, patient with multiple comorbidities, and significant deterioration recently, had significant organ failures including liver cirrhosis, ESRD, pancytopenia and progressive encephalopathy,  as well she is with significant chronic pain syndrome, requiring significant dose of narcotics leading to significant  encephalopathy, as well she does appear uncomfortable  due to pain, after discussion with palliative medicine decision has been made to proceed with comfort measures given the nature of her irreversible organ dysfunctions.  Patient is currently on full comfort measures, she appears to be more comfortable on current regimen with increased dose of Dilaudid.  Lab Results  Component Value Date   HGBA1C 4.7 (L) 08/19/2022    CBG (last 3)  No results for input(s): "GLUCAP" in the last 72 hours.        Condition -comfort care  Family Communication  :    Granddaughter [name] at bedside today  Code Status : DNR  Consults  :  Renal, Haem, neuro, palliative  PUD Prophylaxis :     Procedures  :     CT Head - Non acute  CT Chest - 1. Hepatic cirrhosis. Splenomegaly and left upper quadrant collaterals and splenorenal shunting. Minimal perihepatic ascites. 2. Cholelithiasis without definite pericholecystic inflammation. 3. Dependent atelectasis in both lungs.  No pneumonia. 4. Cardiomegaly.  Coronary artery calcifications. 5. Multiple thoracic  and lumbar compression deformities. Marked compression deformity of L2 and L3 with buckling of the posterior cortex. Moderate L5 compression fracture. There is a fracture through the superior aspect of L1 vertebral body that is age indeterminate. Mild T6 compression fracture.      Disposition Plan  :     DVT Prophylaxis  :       Lab Results  Component Value Date   PLT 17 (LL) 08/22/2022    Diet :  Diet Order             Diet regular Room service appropriate? Yes; Fluid consistency: Thin  Diet effective now                    Inpatient Medications  Scheduled Meds:  antiseptic oral rinse  15 mL Topical BID   Continuous Infusions:  chlorproMAZINE (THORAZINE) 12.5 mg in sodium chloride 0.9 % 25 mL IVPB     dextrose 10 mL/hr at 08/24/22 1335   meropenem (MERREM) IV 500 mg (08/23/22 1506)   methocarbamol (ROBAXIN) IV     PRN Meds:.acetaminophen **OR** acetaminophen, chlorproMAZINE (THORAZINE) 12.5 mg in sodium chloride 0.9 % 25 mL IVPB, diphenhydrAMINE, glycopyrrolate **OR** glycopyrrolate **OR** glycopyrrolate, haloperidol **OR** haloperidol **OR** haloperidol lactate, HYDROmorphone (DILAUDID) injection, LORazepam **OR** LORazepam **OR** LORazepam, methocarbamol (ROBAXIN) IV, ondansetron **OR** ondansetron (ZOFRAN) IV, polyvinyl alcohol, sodium chloride     Phillips Climes M.D on 08/25/2022 at 11:38 AM  To page go to [email or web address]   [facility] -  Office  [phone]  See all Orders from today for further details    Objective:   Vitals:   08/25/22 0832 08/25/22 0838 08/25/22 0900 08/25/22 1000  BP:  (!) 94/46    Pulse:  72    Resp: '15 15 13 13  '$ Temp:      TempSrc:      SpO2:  98%    Weight:      Height:        Wt Readings from Last 3 Encounters:  08/20/22 58.2 kg  05/01/22 72.6 kg    No intake  or output data in the 24 hours ending 08/25/22 1138    Physical Exam  GEN is nonresponsive, appears comfortable  Fair air entry bilaterally   She has upper extremity edema          Data Review:    CBC Recent Labs  Lab 08/19/22 1515 08/19/22 1855 08/20/22 0500 08/21/22 0313 08/22/22 0742  WBC 4.7  --  4.2  4.2 5.2 5.1  HGB 9.1*  --  7.9*  7.7* 8.2* 7.7*  HCT 27.4*  --  24.0*  23.9* 24.1* 23.1*  PLT 26* 21* 22*  22* 17* 17*  MCV 106.6*  --  107.1*  105.8* 101.7* 101.8*  MCH 35.4*  --  35.3*  34.1* 34.6* 33.9  MCHC 33.2  --  32.9  32.2 34.0 33.3  RDW 17.0*  --  17.0*  16.9* 16.6* 16.7*  LYMPHSABS 0.3*  --  0.2* 0.4* 0.2*  MONOABS 0.3  --  0.4 0.5 0.3  EOSABS 0.0  --  0.0 0.0 0.0  BASOSABS 0.0  --  0.0 0.0 0.0    Electrolytes Recent Labs  Lab 08/19/22 1515 08/19/22 1812 08/19/22 1855 08/19/22 2237 08/19/22 2358 08/20/22 0500 08/21/22 0313 08/21/22 0809 08/22/22 0742  NA 132*  --   --   --   --  131* 130*  --  132*  K 4.1  --   --   --   --  3.9 3.4*  --  2.9*  CL 92*  --   --   --   --  93* 92*  --  95*  CO2 28  --   --   --   --  28 25  --  29  GLUCOSE 179*  --   --   --   --  125* 160*  --  107*  BUN 52*  --   --   --   --  60* 72*  --  31*  CREATININE 5.35*  --   --   --   --  5.56* 6.18*  --  3.59*  CALCIUM 10.1  --   --   --   --  9.7 9.9  --  8.7*  AST 60*  --   --   --   --  65* 33  --  47*  ALT 33  --   --   --   --  28 25  --  28  ALKPHOS 75  --   --   --   --  62 70  --  71  BILITOT 2.2*  --   --   --   --  1.9* 2.1*  --  2.7*  ALBUMIN 2.1*  --   --   --   --  2.2* 2.0*  --  2.0*  MG 2.9*  --   --   --   --  2.9* 2.9*  --  2.1  CRP  --   --   --   --   --  18.0*  --   --   --   DDIMER  --   --  3.19*  --   --   --   --   --   --   LATICACIDVEN 2.1* 2.6*  --   --  2.0*  --   --   --   --   INR  --   --  7.4*  --   --  6.7* 3.6*  --  2.5*  TSH  --   --   --   --   --   --  3.171  --   --   HGBA1C  --   --   --  4.7*  --   --   --   --   --   AMMONIA  --  27  --   --   --   --   --  50* 31  BNP  --   --   --   --   --  705.0* 858.9*  --  496.7*   ID Labs Recent Labs  Lab  08/19/22 1515 08/19/22 1812 08/19/22 1855 08/19/22 2358 08/20/22 0500 08/21/22 0313 08/22/22 0742  WBC 4.7  --   --   --  4.2  4.2 5.2 5.1  PLT 26*  --  21*  --  22*  22* 17* 17*  CRP  --   --   --   --  18.0*  --   --   DDIMER  --   --  3.19*  --   --   --   --   LATICACIDVEN 2.1* 2.6*  --  2.0*  --   --   --   CREATININE 5.35*  --   --   --  5.56* 6.18* 3.59*    Micro Results Recent Results (from the past 240 hour(s))  Urine Culture     Status: Abnormal   Collection Time: 08/19/22  9:42 PM   Specimen: In/Out Cath Urine  Result Value Ref Range Status   Specimen Description IN/OUT CATH URINE  Final   Special Requests   Final    NONE Performed at [facility], [address] [place], [place] [address]    Culture (A)  Final    >=100,000 COLONIES/mL KLEBSIELLA PNEUMONIAE Confirmed Extended Spectrum Beta-Lactamase Producer (ESBL).  In bloodstream infections from ESBL organisms, carbapenems are preferred over piperacillin/tazobactam. They are shown to have a lower risk of mortality.    Report Status 08/22/2022 FINAL  Final   Organism ID, Bacteria KLEBSIELLA PNEUMONIAE (A)  Final      Susceptibility   Klebsiella pneumoniae - MIC*    AMPICILLIN >=32 RESISTANT Resistant     CEFAZOLIN >=64 RESISTANT Resistant     CEFEPIME >=32 RESISTANT Resistant     CEFTRIAXONE >=64 RESISTANT Resistant     CIPROFLOXACIN <=0.25 SENSITIVE Sensitive     GENTAMICIN <=1 SENSITIVE Sensitive     IMIPENEM <=0.25 SENSITIVE Sensitive     NITROFURANTOIN 128 RESISTANT Resistant     TRIMETH/SULFA >=320 RESISTANT Resistant     AMPICILLIN/SULBACTAM 16 INTERMEDIATE Intermediate     PIP/TAZO <=4 SENSITIVE Sensitive     * >=100,000 COLONIES/mL KLEBSIELLA PNEUMONIAE    Radiology Reports No results found.

## 2022-08-25 NOTE — TOC Transition Note (Signed)
Transition of Care [facility]) - CM/SW Discharge Note   Patient Details  Name: Dorothy Cooper MRN: [id number] Date of Birth: 25-Oct-1950  Transition of Care [facility]) CM/SW Contact:  Amador Cunas, [place] Phone Number: 08/25/2022, 12:50 PM   Clinical Narrative:   Call received from [facility] with [facility] who reports they have a bed available today. Pt's granddtr at bedside and confirms she has arranged private pay stretcher transport and they plan to arrive around 4pm today. RN provided with number for report [phone] and dc summary faxed to [facility] [phone]. SW signing off at dc.   Wandra Feinstein, MSW, LCSW [phone] (coverage)      Final next level of care: [place] Barriers to Discharge: No Barriers Identified   Patient Goals and CMS Choice Patient states their goals for this hospitalization and ongoing recovery are:: Pt unable to participate in goal setting at this time. CMS Medicare.gov Compare Post Acute Care list provided to:: Patient Represenative (must comment) (Daughter) Choice offered to / list presented to : Adult Children  Discharge Placement              Patient chooses bed at: Other - please specify in the comment section below: [facility]) Patient to be transferred to facility by: Family arranged stretcher transport Name of family member notified: [name]/granddtr Patient and family notified of of transfer: 08/25/22  Discharge Plan and Services     Post Acute Care Choice: [place]                               Social Determinants of Health (SDOH) Interventions     Readmission Risk Interventions     No data to display

## 2022-08-25 NOTE — Progress Notes (Signed)
Daily Progress Note   Patient Name: Dorothy Cooper       Date: 08/25/2022 DOB: 1950-07-20  Age: 72 y.o. MRN#: [id number] Attending Physician: Albertine Patricia, MD Primary Care Physician: Pcp, No Admit Date: 08/19/2022  Reason for Consultation/Follow-up: Establishing goals of care  Subjective: Chart review performed.  Patient assessed at the bedside appears comfortable with no signs of pain or distress.  Discussed patient's pain management regimen with family and they continue to remain satisfied by as needed Dilaudid frequency and dosage.  Granddaughter informs me [facility] although [facility] has not yet reached out to [facility].  Offered to assist with ensuring they are aware.  They have no further concerns since yesterday after discussion with the unit and patient experience.  All questions and concerns addressed. Encouraged to call with questions and/or concerns. PMT card previously provided.  Length of Stay: 5  Physical Exam Vitals and nursing note reviewed.  Constitutional:      General: She is not in acute distress.    Appearance: She is cachectic. She is ill-appearing.  Cardiovascular:     Rate and Rhythm: Normal rate.  Pulmonary:     Effort: No respiratory distress.  Skin:    General: Skin is warm and dry.  Neurological:     Mental Status: She is lethargic.            Vital Signs: BP (!) 94/46 (BP Location: Right Arm)   Pulse 72   Temp (!) 92.6 F (33.7 C) (Axillary)   Resp 14   Ht '5\' 2"'$  (1.575 m)   Wt 58.2 kg   LMP  (LMP Unknown)   SpO2 98%   BMI 23.47 kg/m  SpO2: SpO2: 98 % O2 Device: O2 Device: Nasal Cannula O2 Flow Rate: O2 Flow Rate (L/min): 2 L/min       Palliative Assessment/Data: PPS 10%    Palliative Care Assessment &  Plan   Patient Profile: 72 y.o. female  with past medical history of end-stage renal disease on hemodialysis Tuesday/Thursday/ Saturday, thrombocytopenia, cirrhosis  unclear reason, diastolic CHF, chronic anemia, asthma, COPD, DM2, glaucoma, HLD, HTN pancytopenia, on eliquis for A.fib, migraine headaches presented to ED on 08/19/22 from [facility] with AMS. Patient was admitted on 08/19/2022 with acute metabolic encephalopathy, UTI, cirrhosis, hypotension,  thrombocytopenia, cirrhosis, L and T-spine compression fracture. Neurosurgery states patient is not a surgical candidate; only option is TLSO brace.   Assessment: Principal Problem:   Acute metabolic encephalopathy Active Problems:   UTI (urinary tract infection)   Cirrhosis (HCC)   DM2 (diabetes mellitus, type 2) (HCC)   Paroxysmal atrial fibrillation (HCC)   Elevated INR   Chronic diastolic CHF (congestive heart failure) (HCC)   Hypotension   ESRD (end stage renal disease) (HCC)   Thrombocytopenia (HCC)   Compression fracture of lumbar vertebra (HCC)   Pressure injury of skin   Other pancytopenia ([place])   Terminal care  Recommendations/Plan: Continue DNR Continue comfort care measures Patient stable for transfer to [facility] today Therapeutic listening and emotional support provided PMT will continue to follow and support as needed   Prognosis:  < 2 weeks  Discharge Planning: Hospice facility vs hospital death  Care plan was discussed with patient's family, TOC  Total time: I spent 35 minutes in the care of the patient today in the above activities and documenting the encounter.   [name], PA-C Palliative Medicine Team Team phone # [phone]  Thank you for allowing the Palliative Medicine Team to assist in the care of this patient. Please utilize secure chat with additional questions, if there is no response within 30 minutes please call the above phone number.  Palliative Medicine Team providers are  available by phone from 7am to 7pm daily and can be reached through the team cell phone.  Should this patient require assistance outside of these hours, please call the patient's attending physician.

## 2022-09-30 DEATH — deceased
